# Patient Record
Sex: Female | Born: 1955
Health system: Southern US, Community
[De-identification: ages and names within clinical notes are randomized; demographics above are authoritative.]

## PROBLEM LIST (undated history)

## (undated) DIAGNOSIS — G43909 Migraine, unspecified, not intractable, without status migrainosus: Secondary | ICD-10-CM

## (undated) DIAGNOSIS — E785 Hyperlipidemia, unspecified: Secondary | ICD-10-CM

## (undated) DIAGNOSIS — Z0000 Encounter for general adult medical examination without abnormal findings: Principal | ICD-10-CM

## (undated) DIAGNOSIS — Z860100 Personal history of colon polyps, unspecified: Secondary | ICD-10-CM

## (undated) DIAGNOSIS — N1330 Unspecified hydronephrosis: Secondary | ICD-10-CM

## (undated) DIAGNOSIS — B9689 Other specified bacterial agents as the cause of diseases classified elsewhere: Secondary | ICD-10-CM

## (undated) DIAGNOSIS — K5900 Constipation, unspecified: Secondary | ICD-10-CM

## (undated) DIAGNOSIS — Z973 Presence of spectacles and contact lenses: Secondary | ICD-10-CM

## (undated) DIAGNOSIS — R109 Unspecified abdominal pain: Secondary | ICD-10-CM

## (undated) DIAGNOSIS — J019 Acute sinusitis, unspecified: Secondary | ICD-10-CM

## (undated) DIAGNOSIS — H40009 Preglaucoma, unspecified, unspecified eye: Secondary | ICD-10-CM

## (undated) DIAGNOSIS — M1990 Unspecified osteoarthritis, unspecified site: Secondary | ICD-10-CM

## (undated) HISTORY — DX: Encounter for general adult medical examination without abnormal findings: Z00.00

## (undated) HISTORY — DX: Hyperlipidemia, unspecified: E78.5

## (undated) HISTORY — DX: Constipation, unspecified: K59.00

## (undated) HISTORY — PX: TUBAL LIGATION: SHX77

---

## 1995-07-15 HISTORY — PX: ABDOMINAL HYSTERECTOMY: SHX81

## 2001-07-14 HISTORY — PX: AUGMENTATION MAMMAPLASTY: SUR837

## 2001-07-14 HISTORY — PX: BREAST ENHANCEMENT SURGERY: SHX7

## 2007-03-04 HISTORY — PX: COLONOSCOPY: SHX174

## 2013-09-26 ENCOUNTER — Telehealth: Payer: Self-pay | Admitting: *Deleted

## 2013-10-04 NOTE — Telephone Encounter (Signed)
appt reminder

## 2013-10-17 ENCOUNTER — Encounter: Payer: Self-pay | Admitting: Internal Medicine

## 2013-10-17 ENCOUNTER — Ambulatory Visit (INDEPENDENT_AMBULATORY_CARE_PROVIDER_SITE_OTHER): Payer: 59 | Admitting: Internal Medicine

## 2013-10-17 VITALS — BP 136/88 | HR 72 | Temp 98.5°F | Resp 16 | Wt 146.0 lb

## 2013-10-17 DIAGNOSIS — N951 Menopausal and female climacteric states: Secondary | ICD-10-CM | POA: Insufficient documentation

## 2013-10-17 DIAGNOSIS — E785 Hyperlipidemia, unspecified: Secondary | ICD-10-CM | POA: Insufficient documentation

## 2013-10-17 DIAGNOSIS — B373 Candidiasis of vulva and vagina: Secondary | ICD-10-CM

## 2013-10-17 DIAGNOSIS — Z90710 Acquired absence of both cervix and uterus: Secondary | ICD-10-CM | POA: Insufficient documentation

## 2013-10-17 DIAGNOSIS — N809 Endometriosis, unspecified: Secondary | ICD-10-CM | POA: Insufficient documentation

## 2013-10-17 DIAGNOSIS — K3189 Other diseases of stomach and duodenum: Secondary | ICD-10-CM

## 2013-10-17 DIAGNOSIS — J309 Allergic rhinitis, unspecified: Secondary | ICD-10-CM | POA: Insufficient documentation

## 2013-10-17 DIAGNOSIS — B3731 Acute candidiasis of vulva and vagina: Secondary | ICD-10-CM

## 2013-10-17 DIAGNOSIS — R1013 Epigastric pain: Secondary | ICD-10-CM | POA: Insufficient documentation

## 2013-10-17 NOTE — Progress Notes (Signed)
Subjective:    Patient ID: Alicia Summers, female    DOB: Jan 09, 1956, 58 y.o.   MRN: [id number]  HPI  Alicia Summers is here for first visit to establish primary care.  PMH of hyperlipidemia, symptomatic menopause,   Endometriosis S/P Hysterectomy and allergic rhinitis  She is concerned over her menopausal hot flushes and dyspeptic GI symptoms.   She reports she has been on estradiol (currently  Vivelle Dot .05 mg) prescribed by her GYN for several years duration - she believes around 7 years.  Initially started for symptomatic severe hot flushes.  She also reports going to [place] and having hormone pellets for about 2 years duration.   She became concerned about continuing HT when she found out a friend recently discovered breast cancer .  She stopped her HT abruptly in January of this year.  Hot flushes became so severe , night and day frequently interrupting from sleep that she restarted her Vivelle dot two times a week about 6 weeks ago.  Symptoms better controlled now.    She wishes to discuss long term risks and is wondering about coming off HT.   She is S/P hysterectomY for endometriosis.  Of note she has breast implants since 2003.    She reports recent 2014 mm normal  - I do not have report.  She also has excessive gas and bloating.  She has seen her GI  MD about this and been tested for celiac -reported negative.  She has tried to eliminate gluten from diet.  NO abd pain, no N/VD.  No frank blood or change in color of stools.  Last colonoscopy 8 years ago.    She denies heartburn  Allergies  Allergen Reactions  . Dilaudid [Hydromorphone Hcl] Itching   Past Medical History  Diagnosis Date  . Hyperlipidemia    Past Surgical History  Procedure Laterality Date  . Breast surgery    . Abdominal hysterectomy     History   Social History  . Marital Status: Married    Spouse Name: N/A    Number of Children: N/A  . Years of Education: N/A   Occupational History  . Not on file.    Social History Main Topics  . Smoking status: Never Smoker   . Smokeless tobacco: Never Used  . Alcohol Use: No     Comment: socially   . Drug Use: No  . Sexual Activity: Yes    Birth Control/ Protection: Surgical   Other Topics Concern  . Not on file   Social History Narrative  . No narrative on file   Family History  Problem Relation Age of Onset  . COPD Mother   . Cancer Father 29    prostate  . Cancer Maternal Grandmother 76    colon   Patient Active Problem List   Diagnosis Date Noted  . Hyperlipidemia 10/17/2013  . S/P hysterectomy 10/17/2013  . Allergic rhinitis 10/17/2013  . Endometriosis 10/17/2013  . Yeast vaginitis 10/17/2013  . Menopausal hot flushes 10/17/2013  . Dyspepsia 10/17/2013   No current outpatient prescriptions on file prior to visit.   No current facility-administered medications on file prior to visit.      Review of Systems    see HPI Objective:   Physical Exam Physical Exam  Nursing note and vitals reviewed.  Constitutional: She is oriented to person, place, and time. She appears well-developed and well-nourished.  HENT:  Head: Normocephalic and atraumatic.  Cardiovascular: Normal rate and regular rhythm. Exam reveals  no gallop and no friction rub.  No murmur heard.  Pulmonary/Chest: Breath sounds normal. She has no wheezes. She has no rales.  Abd  BS + no HSM  Soft NT in all quadrant  nondistended Neurological: She is alert and oriented to person, place, and time.  Skin: Skin is warm and dry.  Psychiatric: She has a normal mood and affect. Her behavior is normal.              Assessment & Plan:  Menopausal hot flushes:  I extensively reviewed data from [facility]  Regarding risks of HT - pt given educational handout.   She is very concerned about breast cancer risk and mm interpretation is challenged by breast implants.  It is reasonable to continue HT through the summer and begin slow taper off HT when ambient temps are  cooler.  Pt agreeable to this approach.   Pt to sign for old records and prior mm report  Hyperlipidemia:  On crestor  Will gel recent labs from old records  Allergic rhinitis  Continue Zyrtec  Dyspepsia/bloating    Advised to follow with GI MD for this - OK to try OTC probiotic for now.  She really does not report reflux symptoms  Schedule CPE with me,  Sign old records  45 mins with pt

## 2013-10-17 NOTE — Patient Instructions (Signed)
Mark me as PCP  Schedule CPE

## 2013-12-12 ENCOUNTER — Encounter: Payer: Self-pay | Admitting: Internal Medicine

## 2014-03-13 LAB — HM MAMMOGRAPHY: HM Mammogram: NORMAL

## 2014-03-27 ENCOUNTER — Other Ambulatory Visit: Payer: Self-pay | Admitting: *Deleted

## 2014-03-27 DIAGNOSIS — E785 Hyperlipidemia, unspecified: Secondary | ICD-10-CM

## 2014-03-27 DIAGNOSIS — Z0000 Encounter for general adult medical examination without abnormal findings: Secondary | ICD-10-CM

## 2014-03-28 LAB — LIPID PANEL
Cholesterol: 192 mg/dL (ref 0–200)
HDL: 70 mg/dL (ref >39)
LDL Cholesterol: 97 mg/dL (ref 0–99)
TRIGLYCERIDES: 125 mg/dL (ref <150)
Total CHOL/HDL Ratio: 2.7 Ratio
VLDL: 25 mg/dL (ref 0–40)

## 2014-03-28 LAB — COMPLETE METABOLIC PANEL WITH GFR
ALBUMIN: 4.4 g/dL (ref 3.5–5.2)
ALK PHOS: 52 U/L (ref 39–117)
ALT: 19 U/L (ref 0–35)
AST: 20 U/L (ref 0–37)
BUN: 10 mg/dL (ref 6–23)
CO2: 26 meq/L (ref 19–32)
Calcium: 9.3 mg/dL (ref 8.4–10.5)
Chloride: 104 mEq/L (ref 96–112)
Creat: 0.83 mg/dL (ref 0.50–1.10)
GFR, EST AFRICAN AMERICAN: 89 mL/min
GFR, Est Non African American: 77 mL/min
Glucose, Bld: 90 mg/dL (ref 70–99)
Potassium: 4.1 mEq/L (ref 3.5–5.3)
Sodium: 139 mEq/L (ref 135–145)
Total Bilirubin: 0.6 mg/dL (ref 0.2–1.2)
Total Protein: 6.9 g/dL (ref 6.0–8.3)

## 2014-03-28 LAB — CBC WITH DIFFERENTIAL/PLATELET
BASOS PCT: 1 % (ref 0–1)
Basophils Absolute: 0 10*3/uL (ref 0.0–0.1)
EOS ABS: 0.2 10*3/uL (ref 0.0–0.7)
Eosinophils Relative: 4 % (ref 0–5)
HEMATOCRIT: 40.8 % (ref 36.0–46.0)
HEMOGLOBIN: 13.8 g/dL (ref 12.0–15.0)
LYMPHS ABS: 1.5 10*3/uL (ref 0.7–4.0)
Lymphocytes Relative: 32 % (ref 12–46)
MCH: 30.8 pg (ref 26.0–34.0)
MCHC: 33.8 g/dL (ref 30.0–36.0)
MCV: 91.1 fL (ref 78.0–100.0)
MONO ABS: 0.3 10*3/uL (ref 0.1–1.0)
MONOS PCT: 7 % (ref 3–12)
Neutro Abs: 2.6 10*3/uL (ref 1.7–7.7)
Neutrophils Relative %: 56 % (ref 43–77)
Platelets: 291 10*3/uL (ref 150–400)
RBC: 4.48 MIL/uL (ref 3.87–5.11)
RDW: 13.5 % (ref 11.5–15.5)
WBC: 4.7 10*3/uL (ref 4.0–10.5)

## 2014-03-28 LAB — TSH: TSH: 1.543 u[IU]/mL (ref 0.350–4.500)

## 2014-03-29 LAB — VITAMIN D 25 HYDROXY (VIT D DEFICIENCY, FRACTURES): Vit D, 25-Hydroxy: 40 ng/mL (ref 30–89)

## 2014-04-03 ENCOUNTER — Encounter: Payer: Self-pay | Admitting: Internal Medicine

## 2014-04-03 ENCOUNTER — Ambulatory Visit (INDEPENDENT_AMBULATORY_CARE_PROVIDER_SITE_OTHER): Payer: 59 | Admitting: Internal Medicine

## 2014-04-03 VITALS — BP 134/86 | HR 79 | Temp 97.9°F | Resp 16 | Ht 61.75 in | Wt 145.0 lb

## 2014-04-03 DIAGNOSIS — Z1211 Encounter for screening for malignant neoplasm of colon: Secondary | ICD-10-CM

## 2014-04-03 DIAGNOSIS — E785 Hyperlipidemia, unspecified: Secondary | ICD-10-CM

## 2014-04-03 DIAGNOSIS — L989 Disorder of the skin and subcutaneous tissue, unspecified: Secondary | ICD-10-CM

## 2014-04-03 DIAGNOSIS — Z0000 Encounter for general adult medical examination without abnormal findings: Secondary | ICD-10-CM

## 2014-04-03 DIAGNOSIS — Z23 Encounter for immunization: Secondary | ICD-10-CM

## 2014-04-03 DIAGNOSIS — J3089 Other allergic rhinitis: Secondary | ICD-10-CM

## 2014-04-03 DIAGNOSIS — N951 Menopausal and female climacteric states: Secondary | ICD-10-CM

## 2014-04-03 LAB — HEMOCCULT GUIAC POC 1CARD (OFFICE): FECAL OCCULT BLD: NEGATIVE

## 2014-04-03 LAB — POCT URINALYSIS DIPSTICK
BILIRUBIN UA: NEGATIVE
Blood, UA: NEGATIVE
GLUCOSE UA: NEGATIVE
KETONES UA: NEGATIVE
LEUKOCYTES UA: NEGATIVE
Nitrite, UA: NEGATIVE
Protein, UA: NEGATIVE
Spec Grav, UA: 1.01
Urobilinogen, UA: NEGATIVE
pH, UA: 6.5

## 2014-04-03 NOTE — Progress Notes (Signed)
Subjective:    Patient ID: Alicia Summers, female    DOB: [birth year], 58 y.o.   MRN: [id number]  HPI Last OV: Menopausal hot flushes: I extensively reviewed data from [facility] Regarding risks of HT - pt given educational handout. She is very concerned about breast cancer risk and mm interpretation is challenged by breast implants. It is reasonable to continue HT through the summer and begin slow taper off HT when ambient temps are cooler. Pt agreeable to this approach. Pt to sign for old records and prior mm report  Hyperlipidemia: On crestor Will gel recent labs from old records  Allergic rhinitis Continue Zyrtec  Dyspepsia/bloating Advised to follow with GI MD for this - OK to try OTC probiotic for now. She really does not report reflux symptoms  Schedule CPE with    Todays' visit   [name] is here for CPE  HM:  UTD with mm 02/2014  Gering,  colonsocopy due 2016 pt is a non-smoker  .  S/P hysterectomy for benign reasons  Hyerplipidemia on Crestor  No myalgias   Allergic rhinitis taking flonase  Skin lesion Left shoulder  FH of skin ca  Menopausal hot flushes  Kim would like to taper off her HT   She does not RUQ discomfort for at least 6 months she tells me she has seen her GI MD and has had Ct scan,  Gallbladder scan, and endoscopy with Dr. Alice Reichert  and mulltiple work up with prior Md  I do not have records.  She is on a probiotic  She has not had pain in several weeks now.      Allergies  Allergen Reactions  . Dilaudid [Hydromorphone Hcl] Itching   Past Medical History  Diagnosis Date  . Hyperlipidemia    Past Surgical History  Procedure Laterality Date  . Breast surgery    . Abdominal hysterectomy     History   Social History  . Marital Status: Married    Spouse Name: N/A    Number of Children: N/A  . Years of Education: N/A   Occupational History  . Not on file.   Social History Main Topics  . Smoking status: Never Smoker   . Smokeless tobacco: Never Used  .  Alcohol Use: No     Comment: socially   . Drug Use: No  . Sexual Activity: Yes    Birth Control/ Protection: Surgical   Other Topics Concern  . Not on file   Social History Narrative  . No narrative on file   Family History  Problem Relation Age of Onset  . COPD Mother   . Cancer Father 48    prostate  . Cancer Maternal Grandmother 88    colon   Patient Active Problem List   Diagnosis Date Noted  . Hyperlipidemia 10/17/2013  . S/P hysterectomy 10/17/2013  . Allergic rhinitis 10/17/2013  . Endometriosis 10/17/2013  . Yeast vaginitis 10/17/2013  . Menopausal hot flushes 10/17/2013  . Dyspepsia 10/17/2013   Current Outpatient Prescriptions on File Prior to Visit  Medication Sig Dispense Refill  . Cetirizine HCl (ZYRTEC ALLERGY) 10 MG CAPS Take by mouth.      . estradiol (VIVELLE-DOT) 0.05 MG/24HR patch Place 1 patch onto the skin 2 (two) times a week.      . rosuvastatin (CRESTOR) 10 MG tablet Take 10 mg by mouth daily.       No current facility-administered medications on file prior to visit.       Review  of Systems     Objective:   Physical Exam Physical Exam  Nursing note and vitals reviewed.  Constitutional: She is oriented to person, place, and time. She appears well-developed and well-nourished.  HENT:  Head: Normocephalic and atraumatic.  Right Ear: Tympanic membrane and ear canal normal. No drainage. Tympanic membrane is not injected and not erythematous.  Left Ear: Tympanic membrane and ear canal normal. No drainage. Tympanic membrane is not injected and not erythematous.  Nose: Nose normal. Right sinus exhibits no maxillary sinus tenderness and no frontal sinus tenderness. Left sinus exhibits no maxillary sinus tenderness and no frontal sinus tenderness.  Mouth/Throat: Oropharynx is clear and moist. No oral lesions. No oropharyngeal exudate.  Eyes: Conjunctivae and EOM are normal. Pupils are equal, round, and reactive to light.  Neck: Normal range of  motion. Neck supple. No JVD present. Carotid bruit is not present. No mass and no thyromegaly present.  Cardiovascular: Normal rate, regular rhythm, S1 normal, S2 normal and intact distal pulses. Exam reveals no gallop and no friction rub.  No murmur heard.  Pulses:  Carotid pulses are 2+ on the right side, and 2+ on the left side.  Dorsalis pedis pulses are 2+ on the right side, and 2+ on the left side.  No carotid bruit. No LE edema  Pulmonary/Chest: Breath sounds normal. She has no wheezes. She has no rales. She exhibits no tenderness.  Breast bilateral implants  No discrete masses no nipple discharge no axillary adenopathy bilaterally  Abdominal: Soft. Bowel sounds are normal. She exhibits no distension and no mass. There is no hepatosplenomegaly. There is no tenderness. There is no CVA tenderness.  Musculoskeletal: Normal range of motion.  No active synovitis to joints.  Lymphadenopathy:  She has no cervical adenopathy.  She has no axillary adenopathy.  Right: No inguinal and no supraclavicular adenopathy present.  Left: No inguinal and no supraclavicular adenopathy present.  Neurological: She is alert and oriented to person, place, and time. She has normal strength and normal reflexes. She displays no tremor. No cranial nerve deficit or sensory deficit. Coordination and gait normal.  Skin: Skin is warm and dry. No rash noted. No cyanosis. Nails show no clubbing.   Lesion left shoulder Psychiatric: She has a normal mood and affect. Her speech is normal and behavior is normal. Cognition and memory are normal.           Assessment & Plan:  HM:  Pt advised lung cancer screening guideline  She is a non smoker.  MM negative    Skin lesion  Strong FH of skin ca  Will set up appt with Derm office   Hyperlipidemia  Continue crestor  RUQ abd pain  Will need  old records  Menopausal hot flushes  Advised how to taper vivielle dot over next 3 months    GERD

## 2014-04-03 NOTE — Patient Instructions (Addendum)
Will refer to Dermatology Dr. Delman Cheadle      Will set up appt for you   See me as needed     Your were given a flu vaccine today   For vivelle patch  Use 1/2 patch on second day of week for 30 days then cut patch in 1/2 and use 1/2 patch  twice a week for 30 days in November, then 1/4 patch twice a week in December  And can stop patches mid January    Get cooling pillow

## 2014-04-05 ENCOUNTER — Encounter: Payer: Self-pay | Admitting: *Deleted

## 2014-04-14 ENCOUNTER — Encounter: Payer: Self-pay | Admitting: *Deleted

## 2014-05-02 ENCOUNTER — Other Ambulatory Visit: Payer: Self-pay | Admitting: *Deleted

## 2014-05-02 ENCOUNTER — Telehealth: Payer: Self-pay

## 2014-05-02 MED ORDER — FLUCONAZOLE 150 MG PO TABS: 150.0000 mg | ORAL_TABLET | Freq: Once | ORAL | Status: DC

## 2014-05-02 NOTE — Telephone Encounter (Signed)
Alicia Summers [phone] [id number], [place] called to see if Dr Coralyn Mark could call her in something for a recurrent Yeast infection. Dr Coralyn Mark said she would, so I told [name] we would call something in, but if this did not help to call back and make an appointment to be seen.

## 2014-05-15 ENCOUNTER — Encounter: Payer: Self-pay | Admitting: Internal Medicine

## 2014-06-27 ENCOUNTER — Other Ambulatory Visit: Payer: Self-pay | Admitting: Internal Medicine

## 2014-06-27 ENCOUNTER — Encounter: Payer: Self-pay | Admitting: Internal Medicine

## 2014-06-27 ENCOUNTER — Ambulatory Visit (INDEPENDENT_AMBULATORY_CARE_PROVIDER_SITE_OTHER): Payer: 59 | Admitting: Internal Medicine

## 2014-06-27 VITALS — BP 137/80 | HR 63 | Resp 16 | Ht 62.5 in | Wt 142.0 lb

## 2014-06-27 DIAGNOSIS — G441 Vascular headache, not elsewhere classified: Secondary | ICD-10-CM

## 2014-06-27 DIAGNOSIS — N898 Other specified noninflammatory disorders of vagina: Secondary | ICD-10-CM

## 2014-06-27 DIAGNOSIS — N951 Menopausal and female climacteric states: Secondary | ICD-10-CM

## 2014-06-27 MED ORDER — NABUMETONE 500 MG PO TABS: ORAL_TABLET | ORAL | Status: DC

## 2014-06-27 MED ORDER — CLONIDINE HCL 0.1 MG PO TABS: ORAL_TABLET | ORAL | Status: DC

## 2014-06-27 NOTE — Progress Notes (Signed)
Subjective:    Patient ID: Alicia Summers, female    DOB: 1955-12-03, 58 y.o.   MRN: [id number]  HPI 9/21 HM: Pt advised lung cancer screening guideline She is a non smoker. MM negative   Skin lesion Strong FH of skin ca Will set up appt with Derm office   Hyperlipidemia Continue crestor  RUQ abd pain Will need old records  Menopausal hot flushes Advised how to taper vivielle dot over next 3 months   GERD   TODAY :  Alicia Summers is here for several acute issues  Tension headaches have increased as she is helping to be primary caretaker for her 3 grandchildren as daughter has post-partum depression   NO visual or speech changes no numbness or muscle weakness  She has white vaginal discharge  No odor no itching  Menopausal flushes.  She is tapering off her vivelle. Waking up at night sweating   NO flushes during the day     Allergies  Allergen Reactions  . Dilaudid [Hydromorphone Hcl] Itching   Past Medical History  Diagnosis Date  . Hyperlipidemia    Past Surgical History  Procedure Laterality Date  . Breast surgery    . Abdominal hysterectomy     History   Social History  . Marital Status: Married    Spouse Name: N/A    Number of Children: N/A  . Years of Education: N/A   Occupational History  . Not on file.   Social History Main Topics  . Smoking status: Never Smoker   . Smokeless tobacco: Never Used  . Alcohol Use: No     Comment: socially   . Drug Use: No  . Sexual Activity: Yes    Birth Control/ Protection: Surgical   Other Topics Concern  . Not on file   Social History Narrative   Family History  Problem Relation Age of Onset  . COPD Mother   . Cancer Father 59    prostate  . Cancer Maternal Grandmother 32    colon   Patient Active Problem List   Diagnosis Date Noted  . Hyperlipidemia 10/17/2013  . S/P hysterectomy 10/17/2013  . Allergic rhinitis 10/17/2013  . Endometriosis 10/17/2013  . Yeast vaginitis 10/17/2013  .  Menopausal hot flushes 10/17/2013  . Dyspepsia 10/17/2013   Current Outpatient Prescriptions on File Prior to Visit  Medication Sig Dispense Refill  . Cetirizine HCl (ZYRTEC ALLERGY) 10 MG CAPS Take by mouth.    . estradiol (VIVELLE-DOT) 0.05 MG/24HR patch Place 1 patch onto the skin 2 (two) times a week.    . fluconazole (DIFLUCAN) 150 MG tablet Take 1 tablet (150 mg total) by mouth once. 1 tablet 0  . pantoprazole (PROTONIX) 40 MG tablet Take 40 mg by mouth daily.    . rosuvastatin (CRESTOR) 10 MG tablet Take 10 mg by mouth daily.     No current facility-administered medications on file prior to visit.       Review of Systems See HPI    Objective:   Physical Exam  Physical Exam  Nursing note and vitals reviewed.  Constitutional: She is oriented to person, place, and time. She appears well-developed and well-nourished.  HENT:  Head: Normocephalic and atraumatic.  Cardiovascular: Normal rate and regular rhythm. Exam reveals no gallop and no friction rub.  No murmur heard.  Pulmonary/Chest: Breath sounds normal. She has no wheezes. She has no rales.  Pelvic   Nl external genitalia Vagina no lesions  Discharge clear and no odor  Neurological: She is alert and oriented to person, place, and time.  Skin: Skin is warm and dry.  Psychiatric: She has a normal mood and affect. Her behavior is normal.             Assessment & Plan:  HOt flushes  Will start clonidine 1/2 tab every other night for one week then 1/2 tab nightly   See me in 8 weeks  Vaginal  Discharge   She had finished diflucan   Will send Koh/wet preg GC and chlamydia   Headache likely stress related  Ok for relafen

## 2014-06-27 NOTE — Patient Instructions (Addendum)
See me in February    Call office Thursday for results    Take clonidine  1/2 tablet every other night for one week then 1/2 tablet nightly   Use relafen for headache

## 2014-06-28 LAB — GC/CHLAMYDIA PROBE AMP
CT PROBE, AMP APTIMA: NEGATIVE
GC Probe RNA: NEGATIVE

## 2014-06-28 LAB — WET PREP BY MOLECULAR PROBE
CANDIDA SPECIES: NEGATIVE
Gardnerella vaginalis: POSITIVE — AB
Trichomonas vaginosis: NEGATIVE

## 2014-06-29 ENCOUNTER — Telehealth: Payer: Self-pay | Admitting: Internal Medicine

## 2014-06-29 MED ORDER — METRONIDAZOLE 0.75 % VA GEL: VAGINAL | Status: DC

## 2014-06-29 NOTE — Telephone Encounter (Signed)
Alicia Summers  Call pt and tell her that she has a vaginal infection called bacterial vaginosis  Very common.  I e-scribed Metrogel vaginal gel nightly for one week  Ok to mail labs to her if she wishes.

## 2014-06-29 NOTE — Telephone Encounter (Signed)
I spoke with Alicia Summers and let her know about her lab results. She is going to pick up her RX at [facility]

## 2014-07-26 ENCOUNTER — Encounter: Payer: Self-pay | Admitting: Internal Medicine

## 2014-07-26 NOTE — Progress Notes (Signed)
Subjective:    Patient ID: Alicia Summers, female    DOB: 21-Jan-1956, 59 y.o.   MRN: [id number]  HPI  06/2014 visit HOt flushes Will start clonidine 1/2 tab every other night for one week then 1/2 tab nightly See me in 8 weeks  Vaginal Discharge She had finished diflucan Will send Koh/wet preg GC and chlamydia   Headache likely stress related Ok for relafen  TODAY:  Alicia Summers was seen last month for vaginal discharge.  She was positive for Gardnerella and GC/chlamydia, KOH wet pret all neg     Allergies  Allergen Reactions  . Dilaudid [Hydromorphone Hcl] Itching   Past Medical History  Diagnosis Date  . Hyperlipidemia    Past Surgical History  Procedure Laterality Date  . Breast surgery    . Abdominal hysterectomy     History   Social History  . Marital Status: Married    Spouse Name: N/A    Number of Children: N/A  . Years of Education: N/A   Occupational History  . Not on file.   Social History Main Topics  . Smoking status: Never Smoker   . Smokeless tobacco: Never Used  . Alcohol Use: No     Comment: socially   . Drug Use: No  . Sexual Activity: Yes    Birth Control/ Protection: Surgical   Other Topics Concern  . Not on file   Social History Narrative   Family History  Problem Relation Age of Onset  . COPD Mother   . Cancer Father 61    prostate  . Cancer Maternal Grandmother 77    colon   Patient Active Problem List   Diagnosis Date Noted  . Hyperlipidemia 10/17/2013  . S/P hysterectomy 10/17/2013  . Allergic rhinitis 10/17/2013  . Endometriosis 10/17/2013  . Yeast vaginitis 10/17/2013  . Menopausal hot flushes 10/17/2013  . Dyspepsia 10/17/2013   Current Outpatient Prescriptions on File Prior to Visit  Medication Sig Dispense Refill  . Cetirizine HCl (ZYRTEC ALLERGY) 10 MG CAPS Take by mouth.    . cloNIDine (CATAPRES) 0.1 MG tablet Take 1/2 tablet every other night for 1 week then 1/2 tablet nightly 30 tablet 0  . estradiol  (VIVELLE-DOT) 0.05 MG/24HR patch Place 1 patch onto the skin 2 (two) times a week.    . isometheptene-acetaminophen-dichloralphenazone (MIDRIN) 65-325-100 MG capsule Take 1 capsule by mouth 4 (four) times daily as needed for migraine. Maximum 5 capsules in 12 hours for migraine headaches, 8 capsules in 24 hours for tension headaches.    . metroNIDAZOLE (METROGEL VAGINAL) 0.75 % vaginal gel One vaginal applicator full nightly for 7 nights 70 g 0  . nabumetone (RELAFEN) 500 MG tablet One orally bid for headache 20 tablet 0  . rosuvastatin (CRESTOR) 10 MG tablet Take 10 mg by mouth daily.    . timolol (BETIMOL) 0.25 % ophthalmic solution 1-2 drops 2 (two) times daily.     No current facility-administered medications on file prior to visit.        Review of Systems See HPI    Objective:   Physical Exam  Physical Exam  Nursing note and vitals reviewed.  Constitutional: She is oriented to person, place, and time. She appears well-developed and well-nourished.  HENT:  Head: Normocephalic and atraumatic.  Cardiovascular: Normal rate and regular rhythm. Exam reveals no gallop and no friction rub.  No murmur heard.  Pulmonary/Chest: Breath sounds normal. She has no wheezes. She has no rales.  Neurological: She is alert  and oriented to person, place, and time.  Skin: Skin is warm and dry.  Psychiatric: She has a normal mood and affect. Her behavior is normal.             Assessment & Plan:

## 2014-07-27 ENCOUNTER — Other Ambulatory Visit: Payer: Self-pay | Admitting: Internal Medicine

## 2014-07-27 ENCOUNTER — Ambulatory Visit (INDEPENDENT_AMBULATORY_CARE_PROVIDER_SITE_OTHER): Payer: 59 | Admitting: Internal Medicine

## 2014-07-27 ENCOUNTER — Encounter: Payer: Self-pay | Admitting: Internal Medicine

## 2014-07-27 ENCOUNTER — Ambulatory Visit: Payer: Self-pay | Admitting: Internal Medicine

## 2014-07-27 ENCOUNTER — Telehealth: Payer: Self-pay | Admitting: *Deleted

## 2014-07-27 VITALS — BP 155/90 | HR 74 | Resp 16 | Ht 62.0 in | Wt 143.0 lb

## 2014-07-27 DIAGNOSIS — F439 Reaction to severe stress, unspecified: Secondary | ICD-10-CM

## 2014-07-27 DIAGNOSIS — R232 Flushing: Secondary | ICD-10-CM

## 2014-07-27 DIAGNOSIS — Z658 Other specified problems related to psychosocial circumstances: Secondary | ICD-10-CM

## 2014-07-27 DIAGNOSIS — Z1211 Encounter for screening for malignant neoplasm of colon: Secondary | ICD-10-CM

## 2014-07-27 DIAGNOSIS — N898 Other specified noninflammatory disorders of vagina: Secondary | ICD-10-CM

## 2014-07-27 LAB — POCT URINALYSIS DIPSTICK
Bilirubin, UA: NEGATIVE
Glucose, UA: NEGATIVE
Ketones, UA: NEGATIVE
Leukocytes, UA: NEGATIVE
NITRITE UA: NEGATIVE
Protein, UA: NEGATIVE
RBC UA: NEGATIVE
Spec Grav, UA: 1.015
UROBILINOGEN UA: NEGATIVE
pH, UA: 6.5

## 2014-07-27 LAB — HEMOCCULT GUIAC POC 1CARD (OFFICE)
FECAL OCCULT BLD: NEGATIVE
OCCULT BLOOD DATE: 1142016

## 2014-07-27 MED ORDER — ROSUVASTATIN CALCIUM 10 MG PO TABS: 10.0000 mg | ORAL_TABLET | Freq: Every day | ORAL | Status: DC

## 2014-07-27 NOTE — Telephone Encounter (Signed)
I left Alicia Summers a message in regards to her appointment with Dr. Lynnette Caffey on 08/15/14@ 11:40am -eh

## 2014-07-27 NOTE — Patient Instructions (Signed)
Will set up referral to GYN

## 2014-07-27 NOTE — Progress Notes (Signed)
Subjective:    Patient ID: Alicia Summers, female    DOB: 1956/06/07, 59 y.o.   MRN: [id number]  HPI 12/15 note HOt flushes Will start clonidine 1/2 tab every other night for one week then 1/2 tab nightly See me in 8 weeks  Vaginal Discharge She had finished diflucan Will send Koh/wet preg GC and chlamydia   Headache likely stress related Ok for relafen    Today:  Alicia Summers returns for acute visit.    She was pos for Gardnerella on 12/15.  All other tests negative.  She has finished metronidazole vaginal  gel with reported improvement in vaginal discharge.     5 days ago had bright red blood on underwear.  No dysuria or urgency no diarrhea.  Alicia Summers tells me blood was coming from vaginal area because   "I checked with my finger"    She had a tube of Estrace creme from her prior GYn which she used one time and has not seen any blood since.   CT abd with contrast done 5/15 suggests R liver cyst and sub cm hypodensities of kidneys felt to represent cysts but to small to characterize.  Otherwise unremarkable   Alicia Summers does describe lots of family stress with younger sister dying of cirrhosis in [place].    Allergies  Allergen Reactions  . Dilaudid [Hydromorphone Hcl] Itching   Past Medical History  Diagnosis Date  . Hyperlipidemia    Past Surgical History  Procedure Laterality Date  . Breast surgery    . Abdominal hysterectomy     History   Social History  . Marital Status: Married    Spouse Name: N/A    Number of Children: N/A  . Years of Education: N/A   Occupational History  . Not on file.   Social History Main Topics  . Smoking status: Never Smoker   . Smokeless tobacco: Never Used  . Alcohol Use: No     Comment: socially   . Drug Use: No  . Sexual Activity: Yes    Birth Control/ Protection: Surgical   Other Topics Concern  . Not on file   Social History Narrative   Family History  Problem Relation Age of Onset  . COPD Mother   . Cancer Father 49   prostate  . Cancer Maternal Grandmother 35    colon   Patient Active Problem List   Diagnosis Date Noted  . Hyperlipidemia 10/17/2013  . S/P hysterectomy 10/17/2013  . Allergic rhinitis 10/17/2013  . Endometriosis 10/17/2013  . Yeast vaginitis 10/17/2013  . Menopausal hot flushes 10/17/2013  . Dyspepsia 10/17/2013   Current Outpatient Prescriptions on File Prior to Visit  Medication Sig Dispense Refill  . Cetirizine HCl (ZYRTEC ALLERGY) 10 MG CAPS Take by mouth.    . metroNIDAZOLE (METROGEL VAGINAL) 0.75 % vaginal gel One vaginal applicator full nightly for 7 nights 70 g 0  . rosuvastatin (CRESTOR) 10 MG tablet Take 10 mg by mouth daily.    . timolol (BETIMOL) 0.25 % ophthalmic solution 1-2 drops 2 (two) times daily.    Marland Kitchen isometheptene-acetaminophen-dichloralphenazone (MIDRIN) 65-325-100 MG capsule Take 1 capsule by mouth 4 (four) times daily as needed for migraine. Maximum 5 capsules in 12 hours for migraine headaches, 8 capsules in 24 hours for tension headaches.    . nabumetone (RELAFEN) 500 MG tablet One orally bid for headache (Patient not taking: Reported on 07/27/2014) 20 tablet 0   No current facility-administered medications on file prior to visit.  Review of Systems    see HPI Objective:   Physical Exam Physical Exam  Nursing note and vitals reviewed.   Tearful in office  Constitutional: She is oriented to person, place, and time. She appears well-developed and well-nourished.  HENT:  Head: Normocephalic and atraumatic.  Cardiovascular: Normal rate and regular rhythm. Exam reveals no gallop and no friction rub.  No murmur heard.  Pulmonary/Chest: Breath sounds normal. She has no wheezes. She has no rales.  ABD  BS pos soft non-tender no distended  No HSM  Rectal no mass guaiac neg. Pelvic nl external genitalia  She is S/P hysterectomy for pelvic prolapse Vagina  I see no lesions that can explain bleeding Neurological: She is alert and oriented to person,  place, and time.  Skin: Skin is warm and dry.  Psychiatric: She has a normal mood and affect. Her behavior is normal.        Assessment & Plan:  Blood noted on clothing. :  Vaginal exam unrevealing for cause but pt wishes to make appt with a new GYN   Will refer. U/a CHEmstrip and hemocult both neg in office.    She is S/P hysterectomy for prolapse and endometriosis  Vasomotor flushes:  Alicia Summers is off HT now .  Could not tolerate clonidine  States vasomotor " flushes not so bad now"  Situational stress.  Sister has advanced cirrhosis apparently on life support in [place].

## 2014-08-23 ENCOUNTER — Encounter: Payer: Self-pay | Admitting: *Deleted

## 2014-08-30 ENCOUNTER — Ambulatory Visit: Payer: 59 | Admitting: Internal Medicine

## 2014-12-08 ENCOUNTER — Telehealth: Payer: Self-pay | Admitting: *Deleted

## 2014-12-08 ENCOUNTER — Encounter: Payer: Self-pay | Admitting: *Deleted

## 2014-12-08 NOTE — Telephone Encounter (Signed)
Pre-Visit Call completed with patient and chart updated.   Pre-Visit Info documented in Specialty Comments under [employer].    

## 2014-12-12 ENCOUNTER — Ambulatory Visit (INDEPENDENT_AMBULATORY_CARE_PROVIDER_SITE_OTHER): Payer: 59 | Admitting: Family Medicine

## 2014-12-12 ENCOUNTER — Encounter: Payer: Self-pay | Admitting: Family Medicine

## 2014-12-12 VITALS — BP 122/82 | HR 74 | Temp 98.5°F | Ht 62.0 in | Wt 142.5 lb

## 2014-12-12 DIAGNOSIS — R1011 Right upper quadrant pain: Secondary | ICD-10-CM | POA: Diagnosis not present

## 2014-12-12 DIAGNOSIS — R6889 Other general symptoms and signs: Secondary | ICD-10-CM

## 2014-12-12 DIAGNOSIS — E785 Hyperlipidemia, unspecified: Secondary | ICD-10-CM

## 2014-12-12 DIAGNOSIS — E782 Mixed hyperlipidemia: Secondary | ICD-10-CM

## 2014-12-12 DIAGNOSIS — N809 Endometriosis, unspecified: Secondary | ICD-10-CM

## 2014-12-12 DIAGNOSIS — R1013 Epigastric pain: Secondary | ICD-10-CM | POA: Diagnosis not present

## 2014-12-12 DIAGNOSIS — Z8619 Personal history of other infectious and parasitic diseases: Secondary | ICD-10-CM | POA: Insufficient documentation

## 2014-12-12 DIAGNOSIS — N951 Menopausal and female climacteric states: Secondary | ICD-10-CM

## 2014-12-12 DIAGNOSIS — Z1211 Encounter for screening for malignant neoplasm of colon: Secondary | ICD-10-CM | POA: Diagnosis not present

## 2014-12-12 DIAGNOSIS — J3089 Other allergic rhinitis: Secondary | ICD-10-CM

## 2014-12-12 MED ORDER — ROSUVASTATIN CALCIUM 10 MG PO TABS: 10.0000 mg | ORAL_TABLET | Freq: Every day | ORAL | Status: DC

## 2014-12-12 NOTE — Assessment & Plan Note (Signed)
Small, frequent meals with lean proteins and complex carbs (use the size of fist) 6-8 hours sleep MegaRed krill oil cap daily abySchiff Exercise daily but not before bed No blue-green glowing lights 1 hour before bedtime Icool daily can get at [employer]

## 2014-12-12 NOTE — Progress Notes (Signed)
Pre visit review using our clinic review tool, if applicable. No additional management support is needed unless otherwise documented below in the visit note. 

## 2014-12-12 NOTE — Patient Instructions (Addendum)
Small, frequent meals with lean proteins and complex carbs (use the size of fist) 6-8 hours sleep MegaRed krill oil cap daily by Schiff Exercise daily but not before bed No blue-green glowing lights 1 hour before bedtime Icool daily can get at [employer]  Probiotics, Digestive Advantage or [place]in Negus colon health or online [place] 10 strain probiotic Perimenopause Perimenopause is the time when your body begins to move into the menopause (no menstrual period for 12 straight months). It is a natural process. Perimenopause can begin 2-8 years before the menopause and usually lasts for 1 year after the menopause. During this time, your ovaries may or may not produce an egg. The ovaries vary in their production of estrogen and progesterone hormones each month. This can cause irregular menstrual periods, difficulty getting pregnant, vaginal bleeding between periods, and uncomfortable symptoms. CAUSES  Irregular production of the ovarian hormones, estrogen and progesterone, and not ovulating every month.  Other causes include:  Tumor of the pituitary gland in the brain.  Medical disease that affects the ovaries.  Radiation treatment.  Chemotherapy.  Unknown causes.  Heavy smoking and excessive alcohol intake can bring on perimenopause sooner. SIGNS AND SYMPTOMS   Hot flashes.  Night sweats.  Irregular menstrual periods.  Decreased sex drive.  Vaginal dryness.  Headaches.  Mood swings.  Depression.  Memory problems.  Irritability.  Tiredness.  Weight gain.  Trouble getting pregnant.  The beginning of losing bone cells (osteoporosis).  The beginning of hardening of the arteries (atherosclerosis). DIAGNOSIS  Your health care provider will make a diagnosis by analyzing your age, menstrual history, and symptoms. He or she will do a physical exam and note any changes in your body, especially your female organs. Female hormone tests may or may not be helpful depending on  the amount of female hormones you produce and when you produce them. However, other hormone tests may be helpful to rule out other problems. TREATMENT  In some cases, no treatment is needed. The decision on whether treatment is necessary during the perimenopause should be made by you and your health care provider based on how the symptoms are affecting you and your lifestyle. Various treatments are available, such as:  Treating individual symptoms with a specific medicine for that symptom.  Herbal medicines that can help specific symptoms.  Counseling.  Group therapy. HOME CARE INSTRUCTIONS   Keep track of your menstrual periods (when they occur, how heavy they are, how long between periods, and how long they last) as well as your symptoms and when they started.  Only take over-the-counter or prescription medicines as directed by your health care provider.  Sleep and rest.  Exercise.  Eat a diet that contains calcium (good for your bones) and soy (acts like the estrogen hormone).  Do not smoke.  Avoid alcoholic beverages.  Take vitamin supplements as recommended by your health care provider. Taking vitamin E may help in certain cases.  Take calcium and vitamin D supplements to help prevent bone loss.  Group therapy is sometimes helpful.  Acupuncture may help in some cases. SEEK MEDICAL CARE IF:   You have questions about any symptoms you are having.  You need a referral to a specialist (gynecologist, psychiatrist, or psychologist). SEEK IMMEDIATE MEDICAL CARE IF:   You have vaginal bleeding.  Your period lasts longer than 8 days.  Your periods are recurring sooner than 21 days.  You have bleeding after intercourse.  You have severe depression.  You have pain when you urinate.  You have severe headaches.  You have vision problems. Document Released: 08/07/2004 Document Revised: 04/20/2013 Document Reviewed: 01/27/2013 [facility] Patient Information 2015  [place], [place]. This information is not intended to replace advice given to you by your health care provider. Make sure you discuss any questions you have with your health care provider.

## 2014-12-18 ENCOUNTER — Other Ambulatory Visit: Payer: 59

## 2014-12-18 LAB — LIPID PANEL
Cholesterol: 154 mg/dL (ref 0–200)
HDL: 64.9 mg/dL (ref >39.00)
LDL Cholesterol: 78 mg/dL (ref 0–99)
NONHDL: 89.1
Total CHOL/HDL Ratio: 2
Triglycerides: 58 mg/dL (ref 0.0–149.0)
VLDL: 11.6 mg/dL (ref 0.0–40.0)

## 2014-12-18 LAB — CBC
HCT: 38.8 % (ref 36.0–46.0)
Hemoglobin: 13.2 g/dL (ref 12.0–15.0)
MCHC: 34.1 g/dL (ref 30.0–36.0)
MCV: 90.6 fl (ref 78.0–100.0)
Platelets: 228 10*3/uL (ref 150.0–400.0)
RBC: 4.28 Mil/uL (ref 3.87–5.11)
RDW: 12.7 % (ref 11.5–15.5)
WBC: 4.2 10*3/uL (ref 4.0–10.5)

## 2014-12-18 LAB — COMPREHENSIVE METABOLIC PANEL
ALT: 18 U/L (ref 0–35)
AST: 22 U/L (ref 0–37)
Albumin: 4.4 g/dL (ref 3.5–5.2)
Alkaline Phosphatase: 49 U/L (ref 39–117)
BILIRUBIN TOTAL: 0.5 mg/dL (ref 0.2–1.2)
BUN: 17 mg/dL (ref 6–23)
CO2: 29 mEq/L (ref 19–32)
Calcium: 9.3 mg/dL (ref 8.4–10.5)
Chloride: 106 mEq/L (ref 96–112)
Creatinine, Ser: 0.73 mg/dL (ref 0.40–1.20)
GFR: 86.43 mL/min (ref >60.00)
Glucose, Bld: 78 mg/dL (ref 70–99)
Potassium: 3.9 mEq/L (ref 3.5–5.1)
Sodium: 139 mEq/L (ref 135–145)
Total Protein: 6.9 g/dL (ref 6.0–8.3)

## 2014-12-18 LAB — TSH: TSH: 1.82 u[IU]/mL (ref 0.35–4.50)

## 2014-12-21 ENCOUNTER — Encounter (HOSPITAL_BASED_OUTPATIENT_CLINIC_OR_DEPARTMENT_OTHER): Payer: Self-pay

## 2014-12-21 ENCOUNTER — Other Ambulatory Visit: Payer: Self-pay | Admitting: Family Medicine

## 2014-12-21 ENCOUNTER — Ambulatory Visit (HOSPITAL_BASED_OUTPATIENT_CLINIC_OR_DEPARTMENT_OTHER)
Admission: RE | Admit: 2014-12-21 | Discharge: 2014-12-21 | Disposition: A | Discharge status: Home / Self Care | Payer: 59 | Source: Ambulatory Visit | Attending: Family Medicine | Admitting: Family Medicine

## 2014-12-21 DIAGNOSIS — K7689 Other specified diseases of liver: Secondary | ICD-10-CM | POA: Insufficient documentation

## 2014-12-21 DIAGNOSIS — K5730 Diverticulosis of large intestine without perforation or abscess without bleeding: Secondary | ICD-10-CM | POA: Insufficient documentation

## 2014-12-21 DIAGNOSIS — N134 Hydroureter: Secondary | ICD-10-CM | POA: Diagnosis not present

## 2014-12-21 DIAGNOSIS — R1013 Epigastric pain: Secondary | ICD-10-CM

## 2014-12-21 DIAGNOSIS — R1011 Right upper quadrant pain: Secondary | ICD-10-CM

## 2014-12-21 DIAGNOSIS — R109 Unspecified abdominal pain: Secondary | ICD-10-CM | POA: Diagnosis present

## 2014-12-21 DIAGNOSIS — Z1211 Encounter for screening for malignant neoplasm of colon: Secondary | ICD-10-CM

## 2014-12-21 LAB — IGG FOOD PANEL
ALLERGEN BEEF IGG: 6.6 ug/mL — AB (ref <2.0)
Allergen, Milk, IgG: 8.84 ug/mL — ABNORMAL HIGH (ref <0.15)
Chicken, IgG: <0.15 ug/mL (ref <0.15)
Egg white, IgG: 7.7 ug/mL — ABNORMAL HIGH (ref <2.0)
Egg yolk, IgG: 5.9 ug/mL — ABNORMAL HIGH (ref <2.0)
Peanut, IgG: <0.15 ug/mL (ref <0.15)
Wheat, IgG: <0.15 ug/mL (ref <0.15)

## 2014-12-21 MED ORDER — IOHEXOL 300 MG/ML  SOLN
100.0000 mL | Freq: Once | INTRAMUSCULAR | Status: AC | PRN
  Administered 2014-12-21: 100 mL via INTRAVENOUS

## 2014-12-24 DIAGNOSIS — Z1211 Encounter for screening for malignant neoplasm of colon: Secondary | ICD-10-CM | POA: Insufficient documentation

## 2014-12-24 NOTE — Assessment & Plan Note (Signed)
Uses Cetirizine with some results. May add Flonase as needed

## 2014-12-24 NOTE — Assessment & Plan Note (Signed)
Tolerating statin, encouraged heart healthy diet, avoid trans fats, minimize simple carbs and saturated fats. Increase exercise as tolerated 

## 2014-12-24 NOTE — Assessment & Plan Note (Signed)
Has not had colonoscopy to date, agrees to referral after long discussion

## 2014-12-24 NOTE — Assessment & Plan Note (Signed)
Previous imaging and work up unremarkable, limited sensitivity testing today reveals reactivity to numerous foods, including wheat, beef and eggs encouraged to avoid these foods and started on a probiotic, referred to GI for further consideration

## 2014-12-24 NOTE — Progress Notes (Signed)
Alicia Summers  [id number] 09/20/1955 12/24/2014      Progress Note-Follow Up  Subjective  Chief Complaint  Chief Complaint  Patient presents with  . Establish Care    HPI  Patient is a 59 y.o. female in today for routine medical care. She is in today to establish care as her previous primary care doctor has left practice. Overall she feels well. She does have a past medical history significant for dyspepsia, hyperlipidemia, allergies and. No recent illness or acute complaints. Denies CP/palp/SOB/HA/congestion/fevers or GU c/o. Taking meds as prescribed  Past Medical History  Diagnosis Date  . Hyperlipidemia   . Vaginal atrophy   . History of chicken pox 12/12/2014    Past Surgical History  Procedure Laterality Date  . Breast surgery      implants in 2003  . Abdominal hysterectomy      b/l ovaries intact  . Tubal ligation      Family History  Problem Relation Age of Onset  . COPD Mother   . Cancer Father 3    prostate  . Cancer Maternal Grandmother 63    colon, lung  . Cancer Paternal Grandfather     colon  . Alcohol abuse Sister   . Cirrhosis Sister     History   Social History  . Marital Status: Married    Spouse Name: N/A  . Number of Children: N/A  . Years of Education: N/A   Occupational History  . Not on file.   Social History Main Topics  . Smoking status: Never Smoker   . Smokeless tobacco: Never Used  . Alcohol Use: No     Comment: socially   . Drug Use: No  . 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 avoid dairy   Other Topics Concern  . Not on file   Social History Narrative    Current Outpatient Prescriptions on File Prior to Visit  Medication Sig Dispense Refill  . estradiol (ESTRACE) 0.1 MG/GM vaginal cream Place 1 Applicatorful vaginally at bedtime.    . timolol (BETIMOL) 0.25 % ophthalmic solution 1-2  drops 2 (two) times daily.    . Cetirizine HCl (ZYRTEC ALLERGY) 10 MG CAPS Take by mouth.    . isometheptene-acetaminophen-dichloralphenazone (MIDRIN) 65-325-100 MG capsule Take 1 capsule by mouth 4 (four) times daily as needed for migraine. Maximum 5 capsules in 12 hours for migraine headaches, 8 capsules in 24 hours for tension headaches.     No current facility-administered medications on file prior to visit.    Allergies  Allergen Reactions  . Dilaudid [Hydromorphone Hcl] Itching    Review of Systems  Review of Systems  Constitutional: Negative for fever, chills and malaise/fatigue.  HENT: Negative for congestion, hearing loss and nosebleeds.   Eyes: Negative for discharge.  Respiratory: Negative for cough, sputum production, shortness of breath and wheezing.   Cardiovascular: Negative for chest pain, palpitations and leg swelling.  Gastrointestinal: Positive for heartburn and abdominal pain. Negative for nausea, vomiting, diarrhea, constipation and blood in stool.  Genitourinary: Negative for dysuria, urgency, frequency and hematuria.  Musculoskeletal: Negative for myalgias, back pain and falls.  Skin: Negative for rash.  Neurological: Negative for dizziness, tremors, sensory change, focal weakness, loss of consciousness, weakness and headaches.  Endo/Heme/Allergies: Negative for polydipsia. Does not bruise/bleed easily.  Psychiatric/Behavioral: Negative for depression and suicidal ideas. The patient is not nervous/anxious  and does not have insomnia.     Objective  BP 122/82 mmHg  Pulse 74  Temp(Src) 98.5 F (36.9 C) (Oral)  Ht 5\' 2"  (1.575 m)  Wt 142 lb 8 oz (64.638 kg)  BMI 26.06 kg/m2  SpO2 97%  LMP 10/18/1995  Physical Exam  Physical Exam  Constitutional: She is oriented to person, place, and time and well-developed, well-nourished, and in no distress. No distress.  HENT:  Head: Normocephalic and atraumatic.  Right Ear: External ear normal.  Left Ear: External  ear normal.  Nose: Nose normal.  Mouth/Throat: Oropharynx is clear and moist. No oropharyngeal exudate.  Oropharynx erythematous  Eyes: Conjunctivae are normal. Pupils are equal, round, and reactive to light. Right eye exhibits no discharge. Left eye exhibits no discharge. No scleral icterus.  Neck: Normal range of motion. Neck supple. No thyromegaly present.  Cardiovascular: Normal rate, regular rhythm, normal heart sounds and intact distal pulses.   No murmur heard. Pulmonary/Chest: Effort normal and breath sounds normal. No respiratory distress. She has no wheezes. She has no rales.  Abdominal: Soft. Bowel sounds are normal. She exhibits no distension and no mass. There is no tenderness.  Musculoskeletal: Normal range of motion. She exhibits no edema or tenderness.  Lymphadenopathy:    She has no cervical adenopathy.  Neurological: She is alert and oriented to person, place, and time. She has normal reflexes. No cranial nerve deficit. Coordination normal.  Skin: Skin is warm and dry. No rash noted. She is not diaphoretic.  Psychiatric: Mood, memory and affect normal.    Lab Results  Component Value Date   TSH 1.82 12/18/2014   Lab Results  Component Value Date   WBC 4.2 12/18/2014   HGB 13.2 12/18/2014   HCT 38.8 12/18/2014   MCV 90.6 12/18/2014   PLT 228.0 12/18/2014   Lab Results  Component Value Date   CREATININE 0.73 12/18/2014   BUN 17 12/18/2014   NA 139 12/18/2014   K 3.9 12/18/2014   CL 106 12/18/2014   CO2 29 12/18/2014   Lab Results  Component Value Date   ALT 18 12/18/2014   AST 22 12/18/2014   ALKPHOS 49 12/18/2014   BILITOT 0.5 12/18/2014   Lab Results  Component Value Date   CHOL 154 12/18/2014   Lab Results  Component Value Date   HDL 64.90 12/18/2014   Lab Results  Component Value Date   LDLCALC 78 12/18/2014   Lab Results  Component Value Date   TRIG 58.0 12/18/2014   Lab Results  Component Value Date   CHOLHDL 2 12/18/2014      Assessment & Plan  Menopausal hot flushes Small, frequent meals with lean proteins and complex carbs (use the size of fist) 6-8 hours sleep MegaRed krill oil cap daily abySchiff Exercise daily but not before bed No blue-green glowing lights 1 hour before bedtime Icool daily can get at [employer]  Hyperlipidemia Tolerating statin, encouraged heart healthy diet, avoid trans fats, minimize simple carbs and saturated fats. Increase exercise as tolerated  Allergic rhinitis Uses Cetirizine with some results. May add Flonase as needed  Screen for colon cancer Has not had colonoscopy to date, agrees to referral after long discussion  Dyspepsia Previous imaging and work up unremarkable, limited sensitivity testing today reveals reactivity to numerous foods, including wheat, beef and eggs encouraged to avoid these foods and started on a probiotic, referred to GI for further consideration

## 2015-01-01 ENCOUNTER — Telehealth: Payer: Self-pay | Admitting: *Deleted

## 2015-01-01 NOTE — Telephone Encounter (Signed)
Medical records received via fax from [place]. Forwarded to Dr. Charlett Blake. JG//CMA

## 2015-01-11 ENCOUNTER — Encounter: Payer: Self-pay | Admitting: Gastroenterology

## 2015-01-12 ENCOUNTER — Other Ambulatory Visit: Payer: Self-pay | Admitting: Urology

## 2015-01-22 ENCOUNTER — Ambulatory Visit (INDEPENDENT_AMBULATORY_CARE_PROVIDER_SITE_OTHER): Payer: Commercial Managed Care - HMO | Admitting: Physician Assistant

## 2015-01-22 ENCOUNTER — Encounter: Payer: Self-pay | Admitting: Physician Assistant

## 2015-01-22 VITALS — BP 156/80 | HR 71 | Temp 98.4°F | Ht 62.0 in | Wt 142.0 lb

## 2015-01-22 DIAGNOSIS — J019 Acute sinusitis, unspecified: Secondary | ICD-10-CM | POA: Diagnosis not present

## 2015-01-22 DIAGNOSIS — B9689 Other specified bacterial agents as the cause of diseases classified elsewhere: Secondary | ICD-10-CM | POA: Insufficient documentation

## 2015-01-22 MED ORDER — AMOXICILLIN-POT CLAVULANATE 875-125 MG PO TABS: 1.0000 | ORAL_TABLET | Freq: Two times a day (BID) | ORAL | Status: DC

## 2015-01-22 MED ORDER — FLUCONAZOLE 150 MG PO TABS: 150.0000 mg | ORAL_TABLET | Freq: Once | ORAL | Status: DC

## 2015-01-22 NOTE — Patient Instructions (Signed)
Please take antibiotic as directed.  Increase fluid intake.  Use Saline nasal spray.  Take a daily multivitamin. Use Mucinex-DM for cough and to breakup mucous.  Place a humidifier in the bedroom.  Please call or return clinic if symptoms are not improving. (I have sent in a Diflucan to take if antibiotics cause yeast infection)  Sinusitis Sinusitis is redness, soreness, and swelling (inflammation) of the paranasal sinuses. Paranasal sinuses are air pockets within the bones of your face (beneath the eyes, the middle of the forehead, or above the eyes). In healthy paranasal sinuses, mucus is able to drain out, and air is able to circulate through them by way of your nose. However, when your paranasal sinuses are inflamed, mucus and air can become trapped. This can allow bacteria and other germs to grow and cause infection. Sinusitis can develop quickly and last only a short time (acute) or continue over a long period (chronic). Sinusitis that lasts for more than 12 weeks is considered chronic.  CAUSES  Causes of sinusitis include:  Allergies.  Structural abnormalities, such as displacement of the cartilage that separates your nostrils (deviated septum), which can decrease the air flow through your nose and sinuses and affect sinus drainage.  Functional abnormalities, such as when the small hairs (cilia) that line your sinuses and help remove mucus do not work properly or are not present. SYMPTOMS  Symptoms of acute and chronic sinusitis are the same. The primary symptoms are pain and pressure around the affected sinuses. Other symptoms include:  Upper toothache.  Earache.  Headache.  Bad breath.  Decreased sense of smell and taste.  A cough, which worsens when you are lying flat.  Fatigue.  Fever.  Thick drainage from your nose, which often is green and may contain pus (purulent).  Swelling and warmth over the affected sinuses. DIAGNOSIS  Your caregiver will perform a physical  exam. During the exam, your caregiver may:  Look in your nose for signs of abnormal growths in your nostrils (nasal polyps).  Tap over the affected sinus to check for signs of infection.  View the inside of your sinuses (endoscopy) with a special imaging device with a light attached (endoscope), which is inserted into your sinuses. If your caregiver suspects that you have chronic sinusitis, one or more of the following tests may be recommended:  Allergy tests.  Nasal culture A sample of mucus is taken from your nose and sent to a lab and screened for bacteria.  Nasal cytology A sample of mucus is taken from your nose and examined by your caregiver to determine if your sinusitis is related to an allergy. TREATMENT  Most cases of acute sinusitis are related to a viral infection and will resolve on their own within 10 days. Sometimes medicines are prescribed to help relieve symptoms (pain medicine, decongestants, nasal steroid sprays, or saline sprays).  However, for sinusitis related to a bacterial infection, your caregiver will prescribe antibiotic medicines. These are medicines that will help kill the bacteria causing the infection.  Rarely, sinusitis is caused by a fungal infection. In theses cases, your caregiver will prescribe antifungal medicine. For some cases of chronic sinusitis, surgery is needed. Generally, these are cases in which sinusitis recurs more than 3 times per year, despite other treatments. HOME CARE INSTRUCTIONS   Drink plenty of water. Water helps thin the mucus so your sinuses can drain more easily.  Use a humidifier.  Inhale steam 3 to 4 times a day (for example, sit in  the bathroom with the shower running).  Apply a warm, moist washcloth to your face 3 to 4 times a day, or as directed by your caregiver.  Use saline nasal sprays to help moisten and clean your sinuses.  Take over-the-counter or prescription medicines for pain, discomfort, or fever only as  directed by your caregiver. SEEK IMMEDIATE MEDICAL CARE IF:  You have increasing pain or severe headaches.  You have nausea, vomiting, or drowsiness.  You have swelling around your face.  You have vision problems.  You have a stiff neck.  You have difficulty breathing. MAKE SURE YOU:   Understand these instructions.  Will watch your condition.  Will get help right away if you are not doing well or get worse. Document Released: 06/30/2005 Document Revised: 09/22/2011 Document Reviewed: 07/15/2011 [facility] Patient Information 2014 [place], [place].

## 2015-01-22 NOTE — Progress Notes (Signed)
Pre visit review using our clinic review tool, if applicable. No additional management support is needed unless otherwise documented below in the visit note. 

## 2015-01-22 NOTE — Progress Notes (Signed)
Patient presents to clinic today c/o chest congestion, sinus pressure/sinus pain, ear pain, productive cough that has been present and rapidly worsening x 6 days. Denies fever, chills or chest pain. Has been taking Sudafed for congestion. Has also been taking Mucinex and tylenol with little relief.   Past Medical History  Diagnosis Date  . Hyperlipidemia   . Vaginal atrophy   . History of chicken pox 12/12/2014    Current Outpatient Prescriptions on File Prior to Visit  Medication Sig Dispense Refill  . Cetirizine HCl (ZYRTEC ALLERGY) 10 MG CAPS Take by mouth.    . estradiol (ESTRACE) 0.1 MG/GM vaginal cream Place 1 Applicatorful vaginally at bedtime.    . rosuvastatin (CRESTOR) 10 MG tablet Take 1 tablet (10 mg total) by mouth daily. 90 tablet 1  . timolol (BETIMOL) 0.25 % ophthalmic solution 1-2 drops 2 (two) times daily.    Marland Kitchen isometheptene-acetaminophen-dichloralphenazone (MIDRIN) 65-325-100 MG capsule Take 1 capsule by mouth 4 (four) times daily as needed for migraine. Maximum 5 capsules in 12 hours for migraine headaches, 8 capsules in 24 hours for tension headaches.     No current facility-administered medications on file prior to visit.    Allergies  Allergen Reactions  . Dilaudid [Hydromorphone Hcl] Itching    Family History  Problem Relation Age of Onset  . COPD Mother   . Cancer Father 90    prostate  . Cancer Maternal Grandmother 65    colon, lung  . Cancer Paternal Grandfather     colon  . Alcohol abuse Sister   . Cirrhosis Sister     History   Social History  . Marital Status: Married    Spouse Name: N/A  . Number of Children: N/A  . Years of Education: N/A   Social History Main Topics  . Smoking status: Never Smoker   . Smokeless tobacco: Never Used  . Alcohol Use: No     Comment: socially   . Drug Use: No  . 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 None   Social History Narrative   Review of Systems - See HPI.  All other ROS are negative.  BP 156/80 mmHg  Pulse 71  Temp(Src) 98.4 F (36.9 C) (Oral)  Ht $R'5\' 2"'Yl$  (1.575 m)  Wt 142 lb (64.411 kg)  BMI 25.97 kg/m2  SpO2 100%  LMP 10/18/1995  Physical Exam  Recent Results (from the past 2160 hour(s))  Comp Met (CMET)     Status: None   Collection Time: 12/18/14  7:35 AM  Result Value Ref Range   Sodium 139 135 - 145 mEq/L   Potassium 3.9 3.5 - 5.1 mEq/L   Chloride 106 96 - 112 mEq/L   CO2 29 19 - 32 mEq/L   Glucose, Bld 78 70 - 99 mg/dL   BUN 17 6 - 23 mg/dL   Creatinine, Ser 0.73 0.40 - 1.20 mg/dL   Total Bilirubin 0.5 0.2 - 1.2 mg/dL   Alkaline Phosphatase 49 39 - 117 U/L   AST 22 0 - 37 U/L   ALT 18 0 - 35 U/L   Total Protein 6.9 6.0 - 8.3 g/dL   Albumin 4.4 3.5 - 5.2 g/dL   Calcium 9.3 8.4 - 10.5 mg/dL   GFR 86.43 >60.00 mL/min  CBC     Status:  None   Collection Time: 12/18/14  7:35 AM  Result Value Ref Range   WBC 4.2 4.0 - 10.5 K/uL   RBC 4.28 3.87 - 5.11 Mil/uL   Platelets 228.0 150.0 - 400.0 K/uL   Hemoglobin 13.2 12.0 - 15.0 g/dL   HCT 38.8 36.0 - 46.0 %   MCV 90.6 78.0 - 100.0 fl   MCHC 34.1 30.0 - 36.0 g/dL   RDW 12.7 11.5 - 15.5 %  IgG food panel     Status: Abnormal   Collection Time: 12/18/14  7:35 AM  Result Value Ref Range   Beef, IgG 6.6 (H) <2.0 mcg/mL    Comment:  This test(s) was performed using a kit that has not been cleared  or approved by the FDA. The analytical performance  characteristics of this test have been determined by [facility], [place], [place]. This test, and any  food specific allergen IgG result, should not be used for the  diagnosis of allergic or atopic disease states (except for  sensitivity to milk in neonates and gluten sensitivity). The use  of food specific allergen IgG results should be restricted to the  assessment of  response to therapeutic interventions.    Egg yolk, IgG 5.9 (H) <2.0 mcg/mL    Comment:  This test(s) was performed using a kit that has not been cleared  or approved by the FDA. The analytical performance  characteristics of this test have been determined by [facility], [place], [place]. This test, and any  food specific allergen IgG result, should not be used for the  diagnosis of allergic or atopic disease states (except for  sensitivity to milk in neonates and gluten sensitivity). The use  of food specific allergen IgG results should be restricted to the  assessment of response to therapeutic interventions.    Allergen, Milk, IgG 8.84 (H) <0.15 mcg/mL   Peanut, IgG <0.15 <0.15 mcg/mL   Wheat, IgG <0.15 <0.15 mcg/mL    Comment: The reference range listed on the report is the lower limit of  quantitation for the assay. The clinical utility of food-specific  IgG4 tests has not been clearly established. These tests can be used  in special clinical situations to select foods for evaluation by diet  elimination and challenge in patients who have food-related  complaints, and to evaluate food allergic patients prior to food  challenges.The presence of food-specific IgG4 alone cannot be taken  as evidence of food allergy and only indicates immunologic  sensitization to the food allergen in question. *This test was developed and its performance characteristics  determined by [facility]. It has not been cleared or  approved by the U.S. Food and Drug Administration.    Chicken, IgG <0.15 <0.15 mcg/mL   Corn, IgG <0.15 <0.15 mcg/mL   Egg white, IgG 7.7 (H) <2.0 mcg/mL    Comment:  This test(s) was performed using a kit that has not been cleared  or approved by the FDA. The analytical performance  characteristics of this test have been determined by [facility], [place], [place]. This test, and any  food specific allergen IgG result,  should not be used for the  diagnosis of allergic or atopic disease states (except for  sensitivity to milk in neonates and gluten sensitivity). The use  of food specific allergen IgG results should be restricted to the  assessment of response to therapeutic interventions.   Lipid panel     Status: None  Collection Time: 12/18/14  7:35 AM  Result Value Ref Range   Cholesterol 154 0 - 200 mg/dL    Comment: ATP III Classification       Desirable:  < 200 mg/dL               Borderline High:  200 - 239 mg/dL          High:  > = 240 mg/dL   Triglycerides 58.0 0.0 - 149.0 mg/dL    Comment: Normal:  <150 mg/dLBorderline High:  150 - 199 mg/dL   HDL 64.90 >39.00 mg/dL   VLDL 11.6 0.0 - 40.0 mg/dL   LDL Cholesterol 78 0 - 99 mg/dL   Total CHOL/HDL Ratio 2     Comment:                Men          Women1/2 Average Risk     3.4          3.3Average Risk          5.0          4.42X Average Risk          9.6          7.13X Average Risk          15.0          11.0                       NonHDL 89.10     Comment: NOTE:  Non-HDL goal should be 30 mg/dL higher than patient's LDL goal (i.e. LDL goal of < 70 mg/dL, would have non-HDL goal of < 100 mg/dL)  TSH     Status: None   Collection Time: 12/18/14  7:35 AM  Result Value Ref Range   TSH 1.82 0.35 - 4.50 uIU/mL    Assessment/Plan: Acute bacterial sinusitis Rx Augmentin.  Increase fluids.  Rest.  Saline nasal spray.  Probiotic.  Mucinex as directed.  Humidifier in bedroom.  Call or return to clinic if symptoms are not improving.

## 2015-01-22 NOTE — Assessment & Plan Note (Signed)
Rx Augmentin.  Increase fluids.  Rest.  Saline nasal spray.  Probiotic.  Mucinex as directed.  Humidifier in bedroom.  Call or return to clinic if symptoms are not improving.  

## 2015-01-23 ENCOUNTER — Encounter (HOSPITAL_BASED_OUTPATIENT_CLINIC_OR_DEPARTMENT_OTHER): Payer: Self-pay | Admitting: *Deleted

## 2015-01-23 NOTE — Progress Notes (Signed)
NPO AFTER MN.  ARRIVE AT 0900.  NEEDS HG.  WILL TAKE ZYRTEC AM DOS W/ SIPS OF WATER.

## 2015-01-24 ENCOUNTER — Ambulatory Visit (HOSPITAL_BASED_OUTPATIENT_CLINIC_OR_DEPARTMENT_OTHER)
Admission: RE | Admit: 2015-01-24 | Discharge: 2015-01-24 | Disposition: A | Discharge status: Home / Self Care | Payer: Commercial Managed Care - HMO | Source: Ambulatory Visit | Attending: Urology | Admitting: Urology

## 2015-01-24 ENCOUNTER — Encounter (HOSPITAL_BASED_OUTPATIENT_CLINIC_OR_DEPARTMENT_OTHER): Payer: Self-pay

## 2015-01-24 ENCOUNTER — Ambulatory Visit (HOSPITAL_BASED_OUTPATIENT_CLINIC_OR_DEPARTMENT_OTHER): Payer: Commercial Managed Care - HMO | Admitting: Anesthesiology

## 2015-01-24 ENCOUNTER — Encounter (HOSPITAL_BASED_OUTPATIENT_CLINIC_OR_DEPARTMENT_OTHER)
Admission: RE | Disposition: A | Discharge status: Home / Self Care | Payer: Self-pay | Source: Ambulatory Visit | Attending: Urology

## 2015-01-24 DIAGNOSIS — Z79890 Hormone replacement therapy (postmenopausal): Secondary | ICD-10-CM | POA: Diagnosis not present

## 2015-01-24 DIAGNOSIS — Z79899 Other long term (current) drug therapy: Secondary | ICD-10-CM | POA: Diagnosis not present

## 2015-01-24 DIAGNOSIS — E785 Hyperlipidemia, unspecified: Secondary | ICD-10-CM | POA: Diagnosis not present

## 2015-01-24 DIAGNOSIS — H409 Unspecified glaucoma: Secondary | ICD-10-CM | POA: Diagnosis not present

## 2015-01-24 DIAGNOSIS — N1330 Unspecified hydronephrosis: Secondary | ICD-10-CM | POA: Insufficient documentation

## 2015-01-24 DIAGNOSIS — M1990 Unspecified osteoarthritis, unspecified site: Secondary | ICD-10-CM | POA: Insufficient documentation

## 2015-01-24 DIAGNOSIS — G43909 Migraine, unspecified, not intractable, without status migrainosus: Secondary | ICD-10-CM | POA: Diagnosis not present

## 2015-01-24 HISTORY — DX: Preglaucoma, unspecified, unspecified eye: H40.009

## 2015-01-24 HISTORY — DX: Unspecified hydronephrosis: N13.30

## 2015-01-24 HISTORY — DX: Other specified bacterial agents as the cause of diseases classified elsewhere: B96.89

## 2015-01-24 HISTORY — DX: Personal history of colon polyps, unspecified: Z86.0100

## 2015-01-24 HISTORY — PX: CYSTOSCOPY WITH RETROGRADE PYELOGRAM, URETEROSCOPY AND STENT PLACEMENT: SHX5789

## 2015-01-24 HISTORY — DX: Personal history of colonic polyps: Z86.010

## 2015-01-24 HISTORY — DX: Unspecified osteoarthritis, unspecified site: M19.90

## 2015-01-24 HISTORY — DX: Acute sinusitis, unspecified: J01.90

## 2015-01-24 HISTORY — DX: Migraine, unspecified, not intractable, without status migrainosus: G43.909

## 2015-01-24 HISTORY — DX: Unspecified abdominal pain: R10.9

## 2015-01-24 HISTORY — DX: Presence of spectacles and contact lenses: Z97.3

## 2015-01-24 LAB — POCT HEMOGLOBIN-HEMACUE: Hemoglobin: 14 g/dL (ref 12.0–15.0)

## 2015-01-24 SURGERY — CYSTOURETEROSCOPY, WITH RETROGRADE PYELOGRAM AND STENT INSERTION
Anesthesia: General | Site: Ureter | Laterality: Right

## 2015-01-24 MED ORDER — SODIUM CHLORIDE 0.9 % IR SOLN
Status: DC | PRN
  Administered 2015-01-24: 4000 mL

## 2015-01-24 MED ORDER — MIDAZOLAM HCL 2 MG/2ML IJ SOLN
INTRAMUSCULAR | Status: AC
  Filled 2015-01-24: qty 2

## 2015-01-24 MED ORDER — CEFAZOLIN SODIUM-DEXTROSE 2-3 GM-% IV SOLR
INTRAVENOUS | Status: AC
  Filled 2015-01-24: qty 50

## 2015-01-24 MED ORDER — LIDOCAINE HCL (CARDIAC) 20 MG/ML IV SOLN
INTRAVENOUS | Status: DC | PRN
Start: 2015-01-24 — End: 2015-01-24
  Administered 2015-01-24: 60 mg via INTRAVENOUS

## 2015-01-24 MED ORDER — CEFAZOLIN SODIUM-DEXTROSE 2-3 GM-% IV SOLR
2.0000 g | INTRAVENOUS | Status: AC
  Administered 2015-01-24: 2 g via INTRAVENOUS
  Filled 2015-01-24: qty 50

## 2015-01-24 MED ORDER — PROMETHAZINE HCL 25 MG/ML IJ SOLN
6.2500 mg | INTRAMUSCULAR | Status: DC | PRN
  Filled 2015-01-24: qty 1

## 2015-01-24 MED ORDER — FENTANYL CITRATE (PF) 100 MCG/2ML IJ SOLN
25.0000 ug | INTRAMUSCULAR | Status: DC | PRN
  Filled 2015-01-24: qty 1

## 2015-01-24 MED ORDER — MIDAZOLAM HCL 5 MG/5ML IJ SOLN
INTRAMUSCULAR | Status: DC | PRN
  Administered 2015-01-24: 2 mg via INTRAVENOUS

## 2015-01-24 MED ORDER — PROPOFOL 10 MG/ML IV BOLUS
INTRAVENOUS | Status: DC | PRN
  Administered 2015-01-24: 160 mg via INTRAVENOUS

## 2015-01-24 MED ORDER — ONDANSETRON HCL 4 MG/2ML IJ SOLN
INTRAMUSCULAR | Status: DC | PRN
  Administered 2015-01-24: 4 mg via INTRAVENOUS

## 2015-01-24 MED ORDER — FENTANYL CITRATE (PF) 100 MCG/2ML IJ SOLN
INTRAMUSCULAR | Status: AC
  Filled 2015-01-24: qty 4

## 2015-01-24 MED ORDER — FENTANYL CITRATE (PF) 100 MCG/2ML IJ SOLN
INTRAMUSCULAR | Status: DC | PRN
  Administered 2015-01-24: 100 ug via INTRAVENOUS

## 2015-01-24 MED ORDER — LACTATED RINGERS IV SOLN
INTRAVENOUS | Status: DC
  Filled 2015-01-24: qty 1000

## 2015-01-24 MED ORDER — TRAMADOL HCL 50 MG PO TABS: 50.0000 mg | ORAL_TABLET | Freq: Four times a day (QID) | ORAL | Status: DC | PRN

## 2015-01-24 MED ORDER — MEPERIDINE HCL 25 MG/ML IJ SOLN
6.2500 mg | INTRAMUSCULAR | Status: DC | PRN
  Filled 2015-01-24: qty 1

## 2015-01-24 MED ORDER — EPHEDRINE SULFATE 50 MG/ML IJ SOLN
INTRAMUSCULAR | Status: DC | PRN
  Administered 2015-01-24: 10 mg via INTRAVENOUS

## 2015-01-24 MED ORDER — LACTATED RINGERS IV SOLN
INTRAVENOUS | Status: DC
Start: 2015-01-24 — End: 2015-01-24
  Administered 2015-01-24 (×2): via INTRAVENOUS
  Filled 2015-01-24: qty 1000

## 2015-01-24 MED ORDER — CEFAZOLIN SODIUM 1-5 GM-% IV SOLN
1.0000 g | INTRAVENOUS | Status: DC
  Filled 2015-01-24: qty 50

## 2015-01-24 MED ORDER — IOHEXOL 300 MG/ML  SOLN
INTRAMUSCULAR | Status: DC | PRN
  Administered 2015-01-24: 2 mL via URETHRAL

## 2015-01-24 MED ORDER — DEXAMETHASONE SODIUM PHOSPHATE 4 MG/ML IJ SOLN
INTRAMUSCULAR | Status: DC | PRN
  Administered 2015-01-24: 10 mg via INTRAVENOUS

## 2015-01-24 SURGICAL SUPPLY — 26 items
BAG URO CATCHER STRL LF (DRAPE) ×2 IMPLANT
BASKET LASER NITINOL 1.9FR (BASKET) IMPLANT
BASKET STONE 1.7 NGAGE (UROLOGICAL SUPPLIES) IMPLANT
BASKET ZERO TIP NITINOL 2.4FR (BASKET) IMPLANT
CANISTER SUCT LVC 12 LTR MEDI- (MISCELLANEOUS) IMPLANT
CATH INTERMIT  6FR 70CM (CATHETERS) ×2 IMPLANT
CATH URET 5FR 28IN OPEN ENDED (CATHETERS) IMPLANT
CLOTH BEACON ORANGE TIMEOUT ST (SAFETY) ×2 IMPLANT
FIBER LASER FLEXIVA 365 (UROLOGICAL SUPPLIES) IMPLANT
FIBER LASER TRAC TIP (UROLOGICAL SUPPLIES) IMPLANT
GLOVE BIO SURGEON STRL SZ 6.5 (GLOVE) ×2 IMPLANT
GLOVE BIO SURGEON STRL SZ8 (GLOVE) ×2 IMPLANT
GLOVE INDICATOR 6.5 STRL GRN (GLOVE) ×2 IMPLANT
GLOVE SURG SS PI 7.5 STRL IVOR (GLOVE) ×2 IMPLANT
GOWN STRL REUS W/ TWL LRG LVL3 (GOWN DISPOSABLE) ×2 IMPLANT
GOWN STRL REUS W/ TWL XL LVL3 (GOWN DISPOSABLE) ×1 IMPLANT
GOWN STRL REUS W/TWL LRG LVL3 (GOWN DISPOSABLE) ×2
GOWN STRL REUS W/TWL XL LVL3 (GOWN DISPOSABLE) ×1
GUIDEWIRE ANG ZIPWIRE 038X150 (WIRE) ×2 IMPLANT
GUIDEWIRE STR DUAL SENSOR (WIRE) ×2 IMPLANT
IV NS IRRIG 3000ML ARTHROMATIC (IV SOLUTION) ×2 IMPLANT
MANIFOLD NEPTUNE II (INSTRUMENTS) IMPLANT
PACK CYSTO (CUSTOM PROCEDURE TRAY) ×2 IMPLANT
STENT URET 6FRX26 CONTOUR (STENTS) IMPLANT
SYRINGE 10CC LL (SYRINGE) ×2 IMPLANT
TUBE FEEDING 8FR 16IN STR KANG (MISCELLANEOUS) IMPLANT

## 2015-01-24 NOTE — Anesthesia Preprocedure Evaluation (Addendum)
Anesthesia Evaluation  Patient identified by MRN, date of birth, ID band Patient awake    Reviewed: Allergy & Precautions, NPO status , Patient's Chart, lab work & pertinent test results  Airway Mallampati: II  TM Distance: >3 FB Neck ROM: Full    Dental no notable dental hx.    Pulmonary neg pulmonary ROS,  breath sounds clear to auscultation  Pulmonary exam normal       Cardiovascular negative cardio ROS Normal cardiovascular examRhythm:Regular Rate:Normal     Neuro/Psych negative neurological ROS  negative psych ROS   GI/Hepatic negative GI ROS, Neg liver ROS,   Endo/Other  negative endocrine ROS  Renal/GU negative Renal ROS  negative genitourinary   Musculoskeletal negative musculoskeletal ROS (+)   Abdominal   Peds negative pediatric ROS (+)  Hematology negative hematology ROS (+)   Anesthesia Other Findings   Reproductive/Obstetrics negative OB ROS                             Anesthesia Physical Anesthesia Plan  ASA: I  Anesthesia Plan: General   Post-op Pain Management:    Induction: Intravenous  Airway Management Planned: LMA  Additional Equipment:   Intra-op Plan:   Post-operative Plan: Extubation in OR  Informed Consent: I have reviewed the patients History and Physical, chart, labs and discussed the procedure including the risks, benefits and alternatives for the proposed anesthesia with the patient or authorized representative who has indicated his/her understanding and acceptance.   Dental advisory given  Plan Discussed with: CRNA  Anesthesia Plan Comments:         Anesthesia Quick Evaluation

## 2015-01-24 NOTE — H&P (Signed)
Urology Admission H&P  Chief Complaint: right flank pain  History of Present Illness: Alicia Summers is a 59yo with hx of right mild hydronephrosis who presents today for diagnostic ureteroscopy. She has mild intermittent right flank pain  Past Medical History  Diagnosis Date  . Hyperlipidemia   . History of colon polyps   . Hydronephrosis, right   . Acute bacterial sinusitis     dx 01-22-2015  . Migraine   . Arthritis   . Right flank pain     intermittant  . Borderline glaucoma     bilatera eyes  . Wears glasses    Past Surgical History  Procedure Laterality Date  . Breast enhancement surgery Bilateral 2003  . Colonoscopy  03-04-2007  . Tubal ligation    . Abdominal hysterectomy   1997    w/ Bladder suspension    Home Medications:  Prescriptions prior to admission  Medication Sig Dispense Refill Last Dose  . acetaminophen (TYLENOL) 500 MG tablet Take 1,000 mg by mouth every 6 (six) hours as needed.   01/23/2015 at 1200  . amoxicillin-clavulanate (AUGMENTIN) 875-125 MG per tablet Take 1 tablet by mouth 2 (two) times daily. 20 tablet 0 01/23/2015 at 1900  . Cetirizine HCl (ZYRTEC ALLERGY) 10 MG CAPS Take 1 capsule by mouth every morning.    01/24/2015 at 0730  . estradiol (ESTRACE) 0.1 MG/GM vaginal cream Place 1 Applicatorful vaginally at bedtime.   01/21/2015 at 2100  . rosuvastatin (CRESTOR) 10 MG tablet Take 1 tablet (10 mg total) by mouth daily. (Patient taking differently: Take 10 mg by mouth every evening. ) 90 tablet 1 01/23/2015 at 1000  . timolol (BETIMOL) 0.25 % ophthalmic solution Place 1-2 drops into both eyes 2 (two) times daily.    01/24/2015 at 0730  . fluconazole (DIFLUCAN) 150 MG tablet Take 1 tablet (150 mg total) by mouth once. 1 tablet 0 More than a month at Unknown time  . isometheptene-acetaminophen-dichloralphenazone (MIDRIN) 65-325-100 MG capsule Take 1 capsule by mouth 4 (four) times daily as needed for migraine. Maximum 5 capsules in 12 hours for migraine  headaches, 8 capsules in 24 hours for tension headaches.   Unknown at Unknown time   Allergies:  Allergies  Allergen Reactions  . Dilaudid [Hydromorphone Hcl] Itching    Family History  Problem Relation Age of Onset  . COPD Mother   . Cancer Father 13    prostate  . Cancer Maternal Grandmother 63    colon, lung  . Cancer Paternal Grandfather     colon  . Alcohol abuse Sister   . Cirrhosis Sister    Social History:  reports that she has never smoked. She has never used smokeless tobacco. She reports that she drinks alcohol. She reports that she does not use illicit drugs.  Review of Systems  Genitourinary: Positive for flank pain.  All other systems reviewed and are negative.   Physical Exam:  Vital signs in last 24 hours: Temp:  [98.3 F (36.8 C)] 98.3 F (36.8 C) (07/13 0936) Pulse Rate:  [71] 71 (07/13 0936) Resp:  [14] 14 (07/13 0936) BP: (132)/(85) 132/85 mmHg (07/13 0936) SpO2:  [99 %] 99 % (07/13 0936) Weight:  [63.277 kg (139 lb 8 oz)] 63.277 kg (139 lb 8 oz) (07/13 0936) Physical Exam  Constitutional: She is oriented to person, place, and time. She appears well-developed and well-nourished.  HENT:  Head: Normocephalic and atraumatic.  Eyes: EOM are normal. Pupils are equal, round, and reactive to light.  Neck: Normal range of motion. Neck supple.  Cardiovascular: Normal rate and regular rhythm.   Respiratory: Effort normal and breath sounds normal.  GI: Soft. She exhibits no distension. There is no tenderness.  Musculoskeletal: Normal range of motion.  Neurological: She is alert and oriented to person, place, and time.  Skin: Skin is warm and dry.  Psychiatric: She has a normal mood and affect. Her behavior is normal. Judgment and thought content normal.    Laboratory Data:  Results for orders placed or performed during the hospital encounter of 01/24/15 (from the past 24 hour(s))  Hemoglobin-hemacue, POC     Status: None   Collection Time: 01/24/15  10:31 AM  Result Value Ref Range   Hemoglobin 14.0 12.0 - 15.0 g/dL   No results found for this or any previous visit (from the past 240 hour(s)). Creatinine: No results for input(s): CREATININE in the last 168 hours.   Impression/Assessment:  Right hydronephrosis  Plan:  Risks/benefits/alternatives to right diagnostic ureteroscopy was explained to the patient and she understands and wishes to proceed with surgery  Gratia Disla L 01/24/2015, 12:08 PM

## 2015-01-24 NOTE — Transfer of Care (Signed)
Immediate Anesthesia Transfer of Care Note  Patient: Alicia Summers  Procedure(s) Performed: Procedure(s) (LRB): CYSTOSCOPY WITH RIGHT RETROGRADE PYELOGRAM, DIAGNOSTIC URETEROSCOPY  (Right)  Patient Location: PACU  Anesthesia Type: General  Level of Consciousness: awake, sedated, patient cooperative and responds to stimulation  Airway & Oxygen Therapy: Patient Spontanous Breathing and Patient connected to face mask oxygen  Post-op Assessment: Report given to PACU RN, Post -op Vital signs reviewed and stable and Patient moving all extremities  Post vital signs: Reviewed and stable  Complications: No apparent anesthesia complications

## 2015-01-24 NOTE — Anesthesia Postprocedure Evaluation (Signed)
  Anesthesia Post-op Note  Patient: Alicia Summers  Procedure(s) Performed: Procedure(s) (LRB): CYSTOSCOPY WITH RIGHT RETROGRADE PYELOGRAM, DIAGNOSTIC URETEROSCOPY  (Right)  Patient Location: PACU  Anesthesia Type: General  Level of Consciousness: awake and alert   Airway and Oxygen Therapy: Patient Spontanous Breathing  Post-op Pain: mild  Post-op Assessment: Post-op Vital signs reviewed, Patient's Cardiovascular Status Stable, Respiratory Function Stable, Patent Airway and No signs of Nausea or vomiting  Last Vitals:  Filed Vitals:   01/24/15 1245  BP: 136/64  Pulse: 67  Temp:   Resp: 11    Post-op Vital Signs: stable   Complications: No apparent anesthesia complications

## 2015-01-24 NOTE — Brief Op Note (Signed)
01/24/2015  12:11 PM  PATIENT:  Kathrene Alu  59 y.o. female  PRE-OPERATIVE DIAGNOSIS:  RIGHT HYDRONEPHROSIS  POST-OPERATIVE DIAGNOSIS:  RIGHT HYDRONEPHROSIS  PROCEDURE:  Procedure(s) with comments: CYSTOSCOPY WITH RIGHT RETROGRADE PYELOGRAM, DIAGNOSTIC URETEROSCOPY  (Right) - 83 MIN [id number] [phone]  SURGEON:  Surgeon(s) and Role:    * Cleon Gustin, MD - Primary  PHYSICIAN ASSISTANT:   ASSISTANTS: none   ANESTHESIA:   general  EBL:     BLOOD ADMINISTERED:none  DRAINS: none   LOCAL MEDICATIONS USED:  NONE  SPECIMEN:  No Specimen  DISPOSITION OF SPECIMEN:  N/A  COUNTS:  YES  TOURNIQUET:  * No tourniquets in log *  DICTATION: .Note written in EPIC  PLAN OF CARE: Discharge to home after PACU  PATIENT DISPOSITION:  PACU - hemodynamically stable.   Delay start of Pharmacological VTE agent (>24hrs) due to surgical blood loss or risk of bleeding: not applicable

## 2015-01-24 NOTE — Anesthesia Procedure Notes (Signed)
Procedure Name: LMA Insertion Performed by: Louisa Favaro, [place] Pre-anesthesia Checklist: Patient identified, Emergency Drugs available, Suction available and Patient being monitored Patient Re-evaluated:Patient Re-evaluated prior to inductionOxygen Delivery Method: Circle System Utilized Preoxygenation: Pre-oxygenation with 100% oxygen Intubation Type: IV induction Ventilation: Mask ventilation without difficulty LMA: LMA inserted LMA Size: 4.0 Number of attempts: 1 Airway Equipment and Method: Bite block Placement Confirmation: positive ETCO2 Tube secured with: Tape Dental Injury: Teeth and Oropharynx as per pre-operative assessment      

## 2015-01-24 NOTE — Discharge Instructions (Signed)

## 2015-01-24 NOTE — Op Note (Signed)
.  Preoperative diagnosis: Right hydronephrosis  Postoperative diagnosis: Same  Procedure: 1 cystoscopy 2.  right retrograde pyelography 3.  Intraoperative fluoroscopy, under one hour, with interpretation 4.  Right diagnostic ureteroscopy  Attending: Rosie Fate  Anesthesia: General  Estimated blood loss: None  Drains: none  Specimens: none  Antibiotics: Ancef  Findings: To masses/lesions in urethra or bladder. Ureteral orifices in normal anatomic location. Normal right retrograde pyelogram. No stones or lesions noted in the ureter.  Indications: Patient is a 59 year old female with a history of hydronephrosis.  After discussing treatment options, she decided proceed with right diagnostic ureteroscopy  Procedure her in detail: The patient was brought to the operating room and a brief timeout was done to ensure correct patient, correct procedure, correct site.  General anesthesia was administered patient was placed in dorsal lithotomy position.  Her genitalia was then prepped and draped in usual sterile fashion.  A rigid 21 French cystoscope was passed in the urethra and the bladder.  Bladder was inspected free masses or lesions.  the right ureteral orifices were in the normal orthotopic locations.  a 6 french ureteral catheter was then instilled into the right ureter orifice.  a gentle retrograde was obtained and findings noted above.  we then placed a zip wire through the ureteral catheter and advanced up to the renal pelvis.  we then removed the cystoscope and cannulated the right ureteral orifice with a semirigid ureteroscope.  we then performed ureteroscopy up to the level of the UPJ. No stone or tumor was encountered.  the bladder was then drained and this concluded the procedure which was well tolerated by patient.  Complications: None  Condition: Stable, extubated, transferred to PACU  Plan: Pt is to followup in 2 weeks

## 2015-01-25 ENCOUNTER — Encounter: Payer: Self-pay | Admitting: Physician Assistant

## 2015-01-25 MED ORDER — DOXYCYCLINE HYCLATE 100 MG PO CAPS: 100.0000 mg | ORAL_CAPSULE | Freq: Two times a day (BID) | ORAL | Status: DC

## 2015-01-29 ENCOUNTER — Encounter (HOSPITAL_BASED_OUTPATIENT_CLINIC_OR_DEPARTMENT_OTHER): Payer: Self-pay | Admitting: Urology

## 2015-02-20 ENCOUNTER — Other Ambulatory Visit: Payer: Self-pay

## 2015-02-20 ENCOUNTER — Encounter: Payer: Self-pay | Admitting: Family Medicine

## 2015-02-20 DIAGNOSIS — Z1231 Encounter for screening mammogram for malignant neoplasm of breast: Secondary | ICD-10-CM

## 2015-03-21 ENCOUNTER — Encounter: Payer: Self-pay | Admitting: Gastroenterology

## 2015-03-21 ENCOUNTER — Ambulatory Visit (INDEPENDENT_AMBULATORY_CARE_PROVIDER_SITE_OTHER): Payer: Commercial Managed Care - HMO | Admitting: Gastroenterology

## 2015-03-21 VITALS — BP 132/80 | HR 68 | Ht 62.0 in | Wt 141.4 lb

## 2015-03-21 DIAGNOSIS — R1011 Right upper quadrant pain: Secondary | ICD-10-CM

## 2015-03-21 DIAGNOSIS — R140 Abdominal distension (gaseous): Secondary | ICD-10-CM | POA: Diagnosis not present

## 2015-03-21 MED ORDER — HYOSCYAMINE SULFATE 0.125 MG SL SUBL: SUBLINGUAL_TABLET | SUBLINGUAL | Status: DC

## 2015-03-21 NOTE — Progress Notes (Signed)
    History of Present Illness: This is a 59 year old female referred by Mosie Lukes, MD for the evaluation of right upper quadrant pain and bloating. The patient relates a many year history of frequent postprandial pain in her right upper quadrant, right flank and right back. She cannot clearly determine any specific foods or activities that precipitate symptoms. She did note that when she was on vacation last week she did not have any symptoms. Increased colonic stool was noted by her urologist on imaging and she was advised to use MiraLAX for one month. She is not sure that her symptoms improved. She was previously evaluated at [facility] gastroenterology and underwent upper endoscopy in October 2014 for heartburn and bloating and it was normal. She previously underwent colonoscopy in May 2008 showing mild diverticulosis. She does note intolerance to milk products and avoids them. Denies weight loss, constipation, diarrhea, change in stool caliber, melena, hematochezia, nausea, vomiting, dysphagia, reflux symptoms, chest pain.   Review of Systems: Pertinent positive and negative review of systems were noted in the above HPI section. All other review of systems were otherwise negative.  Current Medications, Allergies, Past Medical History, Past Surgical History, Family History and Social History were reviewed in [employer] record.  Physical Exam: General: Well developed, well nourished, no acute distress Head: Normocephalic and atraumatic Eyes:  sclerae anicteric, EOMI Ears: Normal auditory acuity Mouth: No deformity or lesions Neck: Supple, no masses or thyromegaly Lungs: Clear throughout to auscultation. Mild tenderness over right anterior lower ribs and right posterior lower ribs Heart: Regular rate and rhythm; no murmurs, rubs or bruits Abdomen: Soft, non tender and non distended. No masses, hepatosplenomegaly or hernias noted. Normal Bowel sounds Musculoskeletal:  Symmetrical with no gross deformities  Skin: No lesions on visible extremities Pulses:  Normal pulses noted Extremities: No clubbing, cyanosis, edema or deformities noted Neurological: Alert oriented x 4, grossly nonfocal Cervical Nodes:  No significant cervical adenopathy Inguinal Nodes: No significant inguinal adenopathy Psychological:  Alert and cooperative. Normal mood and affect  Assessment and Recommendations:  1. Right upper quadrant pain, abdominal bloating, tenderness over lower ribs anteriorly and posteriorly. 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 She may also have a component of constipation exacerbating her symptoms. Continue MiraLAX once daily. Gas-X 4 times a day as needed. Begin hyoscyamine 1-2 before meals and every 4 hours as needed. Plan for trial of a PPI and consider CCK if above measures are not helpful. Return office visit 6 weeks.  cc: Mosie Lukes, MD [address] [place], [place] [address]

## 2015-03-21 NOTE — Patient Instructions (Signed)
We have sent the following medications to your pharmacy for you to pick up at your convenience:Levsin.  You can take over the counter Gas-X four times a day as needed for gas and bloating.  Start Miralax over the counter mixing 17 grams in 8 oz of water daily for constipation.  Your follow up appointment with Dr. Fuller Plan is on 05/09/15 at 10:45am.   Thank you for choosing me and [facility].  Pricilla Riffle. Dagoberto Ligas., MD., [facility] Regal

## 2015-04-02 ENCOUNTER — Ambulatory Visit
Admission: RE | Admit: 2015-04-02 | Discharge: 2015-04-02 | Disposition: A | Discharge status: Home / Self Care | Payer: Commercial Managed Care - HMO | Source: Ambulatory Visit

## 2015-04-02 DIAGNOSIS — Z1231 Encounter for screening mammogram for malignant neoplasm of breast: Secondary | ICD-10-CM

## 2015-05-09 ENCOUNTER — Ambulatory Visit: Payer: Commercial Managed Care - HMO | Admitting: Gastroenterology

## 2015-06-11 ENCOUNTER — Encounter: Payer: Self-pay | Admitting: Family Medicine

## 2015-06-12 ENCOUNTER — Other Ambulatory Visit: Payer: Self-pay | Admitting: Family Medicine

## 2015-06-12 DIAGNOSIS — E785 Hyperlipidemia, unspecified: Secondary | ICD-10-CM

## 2015-06-12 NOTE — Telephone Encounter (Signed)
Labs entered per mychart message. 

## 2015-06-13 ENCOUNTER — Other Ambulatory Visit (INDEPENDENT_AMBULATORY_CARE_PROVIDER_SITE_OTHER): Payer: Commercial Managed Care - HMO

## 2015-06-13 DIAGNOSIS — E785 Hyperlipidemia, unspecified: Secondary | ICD-10-CM | POA: Diagnosis not present

## 2015-06-13 LAB — COMPREHENSIVE METABOLIC PANEL
ALK PHOS: 58 U/L (ref 39–117)
ALT: 19 U/L (ref 0–35)
AST: 20 U/L (ref 0–37)
Albumin: 3.9 g/dL (ref 3.5–5.2)
BUN: 11 mg/dL (ref 6–23)
CHLORIDE: 106 meq/L (ref 96–112)
CO2: 30 meq/L (ref 19–32)
Calcium: 9.5 mg/dL (ref 8.4–10.5)
Creatinine, Ser: 0.72 mg/dL (ref 0.40–1.20)
GFR: 87.67 mL/min (ref >60.00)
GLUCOSE: 88 mg/dL (ref 70–99)
POTASSIUM: 4.2 meq/L (ref 3.5–5.1)
SODIUM: 142 meq/L (ref 135–145)
TOTAL PROTEIN: 6.9 g/dL (ref 6.0–8.3)
Total Bilirubin: 0.3 mg/dL (ref 0.2–1.2)

## 2015-06-13 LAB — LIPID PANEL
CHOL/HDL RATIO: 4
Cholesterol: 167 mg/dL (ref 0–200)
HDL: 47.1 mg/dL (ref >39.00)
LDL Cholesterol: 97 mg/dL (ref 0–99)
NONHDL: 120.33
Triglycerides: 117 mg/dL (ref 0.0–149.0)
VLDL: 23.4 mg/dL (ref 0.0–40.0)

## 2015-06-15 ENCOUNTER — Encounter: Payer: Self-pay | Admitting: Family Medicine

## 2015-06-15 ENCOUNTER — Ambulatory Visit (INDEPENDENT_AMBULATORY_CARE_PROVIDER_SITE_OTHER): Payer: Commercial Managed Care - HMO | Admitting: Family Medicine

## 2015-06-15 VITALS — BP 138/82 | HR 74 | Temp 98.1°F | Ht 62.0 in | Wt 142.4 lb

## 2015-06-15 DIAGNOSIS — Z0000 Encounter for general adult medical examination without abnormal findings: Secondary | ICD-10-CM

## 2015-06-15 DIAGNOSIS — Z23 Encounter for immunization: Secondary | ICD-10-CM | POA: Diagnosis not present

## 2015-06-15 DIAGNOSIS — H65191 Other acute nonsuppurative otitis media, right ear: Secondary | ICD-10-CM

## 2015-06-15 DIAGNOSIS — B379 Candidiasis, unspecified: Secondary | ICD-10-CM | POA: Diagnosis not present

## 2015-06-15 DIAGNOSIS — K5900 Constipation, unspecified: Secondary | ICD-10-CM | POA: Diagnosis not present

## 2015-06-15 DIAGNOSIS — E785 Hyperlipidemia, unspecified: Secondary | ICD-10-CM | POA: Diagnosis not present

## 2015-06-15 DIAGNOSIS — R1013 Epigastric pain: Secondary | ICD-10-CM

## 2015-06-15 HISTORY — DX: Encounter for general adult medical examination without abnormal findings: Z00.00

## 2015-06-15 MED ORDER — ROSUVASTATIN CALCIUM 5 MG PO TABS: 5.0000 mg | ORAL_TABLET | Freq: Every day | ORAL | Status: DC

## 2015-06-15 MED ORDER — ISOMETHEPTENE-DICHLORAL-APAP 65-100-325 MG PO CAPS: 1.0000 | ORAL_CAPSULE | Freq: Four times a day (QID) | ORAL | Status: DC | PRN

## 2015-06-15 MED ORDER — FLUCONAZOLE 150 MG PO TABS: 150.0000 mg | ORAL_TABLET | Freq: Once | ORAL | Status: DC

## 2015-06-15 MED ORDER — CEFDINIR 300 MG PO CAPS: 300.0000 mg | ORAL_CAPSULE | Freq: Two times a day (BID) | ORAL | Status: AC

## 2015-06-15 NOTE — Progress Notes (Signed)
Pre visit review using our clinic review tool, if applicable. No additional management support is needed unless otherwise documented below in the visit note. 

## 2015-06-15 NOTE — Patient Instructions (Signed)
Encouraged increased hydration and fiber in diet. Daily probiotics. If bowels not moving can use MOM 2 tbls po in 4 oz of warm prune juice by mouth every 2-3 days. If no results then repeat in 4 hours with  Dulcolax suppository pr, may repeat again in 4 more hours as needed. Seek care if symptoms worsen. Consider daily Miralax and/or Dulcolax if symptoms persist.   Encouraged increased rest and hydration, add probiotics, zinc such as Coldeze or Xicam, cap. Treat fevers as needed. Mucinex twice daily x 10 days, Afrin at bed for a couple days as needed. Elderberry, Vitamin C 1000 mg daily, Aged or Black Garlic caps. [email or web address], [place] for Adults, Female A healthy lifestyle and preventive care can promote health and wellness. Preventive health guidelines for women include the following key practices.  A routine yearly physical is a good way to check with your health care provider about your health and preventive screening. It is a chance to share any concerns and updates on your health and to receive a thorough exam.  Visit your dentist for a routine exam and preventive care every 6 months. Brush your teeth twice a day and floss once a day. Good oral hygiene prevents tooth decay and gum disease.  The frequency of eye exams is based on your age, health, family medical history, use of contact lenses, and other factors. Follow your health care provider's recommendations for frequency of eye exams.  Eat a healthy diet. Foods like vegetables, fruits, whole grains, low-fat dairy products, and lean protein foods contain the nutrients you need without too many calories. Decrease your intake of foods high in solid fats, added sugars, and salt. Eat the right amount of calories for you.Get information about a proper diet from your health care provider, if necessary.  Regular physical exercise is one of the most important things you can do for your health. Most adults should get at  least 150 minutes of moderate-intensity exercise (any activity that increases your heart rate and causes you to sweat) each week. In addition, most adults need muscle-strengthening exercises on 2 or more days a week.  Maintain a healthy weight. The body mass index (BMI) is a screening tool to identify possible weight problems. It provides an estimate of body fat based on height and weight. Your health care provider can find your BMI and can help you achieve or maintain a healthy weight.For adults 20 years and older:  A BMI below 18.5 is considered underweight.  A BMI of 18.5 to 24.9 is normal.  A BMI of 25 to 29.9 is considered overweight.  A BMI of 30 and above is considered obese.  Maintain normal blood lipids and cholesterol levels by exercising and minimizing your intake of saturated fat. Eat a balanced diet with plenty of fruit and vegetables. Blood tests for lipids and cholesterol should begin at age 95 and be repeated every 5 years. If your lipid or cholesterol levels are high, you are over 50, or you are at high risk for heart disease, you may need your cholesterol levels checked more frequently.Ongoing high lipid and cholesterol levels should be treated with medicines if diet and exercise are not working.  If you smoke, find out from your health care provider how to quit. If you do not use tobacco, do not start.  Lung cancer screening is recommended for adults aged 56-80 years who are at high risk for developing lung cancer because of a history of smoking. A yearly  low-dose CT scan of the lungs is recommended for people who have at least a 30-pack-year history of smoking and are a current smoker or have quit within the past 15 years. A pack year of smoking is smoking an average of 1 pack of cigarettes a day for 1 year (for example: 1 pack a day for 30 years or 2 packs a day for 15 years). Yearly screening should continue until the smoker has stopped smoking for at least 15 years. Yearly  screening should be stopped for people who develop a health problem that would prevent them from having lung cancer treatment.  If you are pregnant, do not drink alcohol. If you are breastfeeding, be very cautious about drinking alcohol. If you are not pregnant and choose to drink alcohol, do not have more than 1 drink per day. One drink is considered to be 12 ounces (355 mL) of beer, 5 ounces (148 mL) of wine, or 1.5 ounces (44 mL) of liquor.  Avoid use of street drugs. Do not share needles with anyone. Ask for help if you need support or instructions about stopping the use of drugs.  High blood pressure causes heart disease and increases the risk of stroke. Your blood pressure should be checked at least every 1 to 2 years. Ongoing high blood pressure should be treated with medicines if weight loss and exercise do not work.  If you are 29-50 years old, ask your health care provider if you should take aspirin to prevent strokes.  Diabetes screening is done by taking a blood sample to check your blood glucose level after you have not eaten for a certain period of time (fasting). If you are not overweight and you do not have risk factors for diabetes, you should be screened once every 3 years starting at age 11. If you are overweight or obese and you are 39-21 years of age, you should be screened for diabetes every year as part of your cardiovascular risk assessment.  Breast cancer screening is essential preventive care for women. You should practice "breast self-awareness." This means understanding the normal appearance and feel of your breasts and may include breast self-examination. Any changes detected, no matter how small, should be reported to a health care provider. Women in their 51s and 30s should have a clinical breast exam (CBE) by a health care provider as part of a regular health exam every 1 to 3 years. After age 61, women should have a CBE every year. Starting at age 13, women should  consider having a mammogram (breast X-ray test) every year. Women who have a family history of breast cancer should talk to their health care provider about genetic screening. Women at a high risk of breast cancer should talk to their health care providers about having an MRI and a mammogram every year.  Breast cancer gene (BRCA)-related cancer risk assessment is recommended for women who have family members with BRCA-related cancers. BRCA-related cancers include breast, ovarian, tubal, and peritoneal cancers. Having family members with these cancers may be associated with an increased risk for harmful changes (mutations) in the breast cancer genes BRCA1 and BRCA2. Results of the assessment will determine the need for genetic counseling and BRCA1 and BRCA2 testing.  Your health care provider may recommend that you be screened regularly for cancer of the pelvic organs (ovaries, uterus, and vagina). This screening involves a pelvic examination, including checking for microscopic changes to the surface of your cervix (Pap test). You may be encouraged to  have this screening done every 3 years, beginning at age 46.  For women ages 103-65, health care providers may recommend pelvic exams and Pap testing every 3 years, or they may recommend the Pap and pelvic exam, combined with testing for human papilloma virus (HPV), every 5 years. Some types of HPV increase your risk of cervical cancer. Testing for HPV may also be done on women of any age with unclear Pap test results.  Other health care providers may not recommend any screening for nonpregnant women who are considered low risk for pelvic cancer and who do not have symptoms. Ask your health care provider if a screening pelvic exam is right for you.  If you have had past treatment for cervical cancer or a condition that could lead to cancer, you need Pap tests and screening for cancer for at least 20 years after your treatment. If Pap tests have been  discontinued, your risk factors (such as having a new sexual partner) need to be reassessed to determine if screening should resume. Some women have medical problems that increase the chance of getting cervical cancer. In these cases, your health care provider may recommend more frequent screening and Pap tests.  Colorectal cancer can be detected and often prevented. Most routine colorectal cancer screening begins at the age of 27 years and continues through age 86 years. However, your health care provider may recommend screening at an earlier age if you have risk factors for colon cancer. On a yearly basis, your health care provider may provide home test kits to check for hidden blood in the stool. Use of a small camera at the end of a tube, to directly examine the colon (sigmoidoscopy or colonoscopy), can detect the earliest forms of colorectal cancer. Talk to your health care provider about this at age 52, when routine screening begins. Direct exam of the colon should be repeated every 5-10 years through age 75 years, unless early forms of precancerous polyps or small growths are found.  People who are at an increased risk for hepatitis B should be screened for this virus. You are considered at high risk for hepatitis B if:  You were born in a country where hepatitis B occurs often. Talk with your health care provider about which countries are considered high risk.  Your parents were born in a high-risk country and you have not received a shot to protect against hepatitis B (hepatitis B vaccine).  You have HIV or AIDS.  You use needles to inject street drugs.  You live with, or have sex with, someone who has hepatitis B.  You get hemodialysis treatment.  You take certain medicines for conditions like cancer, organ transplantation, and autoimmune conditions.  Hepatitis C blood testing is recommended for all people born from [birth year] through 1965 and any individual with known risks for hepatitis  C.  Practice safe sex. Use condoms and avoid high-risk sexual practices to reduce the spread of sexually transmitted infections (STIs). STIs include gonorrhea, chlamydia, syphilis, trichomonas, herpes, HPV, and human immunodeficiency virus (HIV). Herpes, HIV, and HPV are viral illnesses that have no cure. They can result in disability, cancer, and death.  You should be screened for sexually transmitted illnesses (STIs) including gonorrhea and chlamydia if:  You are sexually active and are younger than 24 years.  You are older than 24 years and your health care provider tells you that you are at risk for this type of infection.  Your sexual activity has changed since you were  last screened and you are at an increased risk for chlamydia or gonorrhea. Ask your health care provider if you are at risk.  If you are at risk of being infected with HIV, it is recommended that you take a prescription medicine daily to prevent HIV infection. This is called preexposure prophylaxis (PrEP). You are considered at risk if:  You are sexually active and do not regularly use condoms or know the HIV status of your partner(s).  You take drugs by injection.  You are sexually active with a partner who has HIV.  Talk with your health care provider about whether you are at high risk of being infected with HIV. If you choose to begin PrEP, you should first be tested for HIV. You should then be tested every 3 months for as long as you are taking PrEP.  Osteoporosis is a disease in which the bones lose minerals and strength with aging. This can result in serious bone fractures or breaks. The risk of osteoporosis can be identified using a bone density scan. Women ages 57 years and over and women at risk for fractures or osteoporosis should discuss screening with their health care providers. Ask your health care provider whether you should take a calcium supplement or vitamin D to reduce the rate of  osteoporosis.  Menopause can be associated with physical symptoms and risks. Hormone replacement therapy is available to decrease symptoms and risks. You should talk to your health care provider about whether hormone replacement therapy is right for you.  Use sunscreen. Apply sunscreen liberally and repeatedly throughout the day. You should seek shade when your shadow is shorter than you. Protect yourself by wearing long sleeves, pants, a wide-brimmed hat, and sunglasses year round, whenever you are outdoors.  Once a month, do a whole body skin exam, using a mirror to look at the skin on your back. Tell your health care provider of new moles, moles that have irregular borders, moles that are larger than a pencil eraser, or moles that have changed in shape or color.  Stay current with required vaccines (immunizations).  Influenza vaccine. All adults should be immunized every year.  Tetanus, diphtheria, and acellular pertussis (Td, Tdap) vaccine. Pregnant women should receive 1 dose of Tdap vaccine during each pregnancy. The dose should be obtained regardless of the length of time since the last dose. Immunization is preferred during the 27th-36th week of gestation. An adult who has not previously received Tdap or who does not know her vaccine status should receive 1 dose of Tdap. This initial dose should be followed by tetanus and diphtheria toxoids (Td) booster doses every 10 years. Adults with an unknown or incomplete history of completing a 3-dose immunization series with Td-containing vaccines should begin or complete a primary immunization series including a Tdap dose. Adults should receive a Td booster every 10 years.  Varicella vaccine. An adult without evidence of immunity to varicella should receive 2 doses or a second dose if she has previously received 1 dose. Pregnant females who do not have evidence of immunity should receive the first dose after pregnancy. This first dose should be  obtained before leaving the health care facility. The second dose should be obtained 4-8 weeks after the first dose.  Human papillomavirus (HPV) vaccine. Females aged 13-26 years who have not received the vaccine previously should obtain the 3-dose series. The vaccine is not recommended for use in pregnant females. However, pregnancy testing is not needed before receiving a dose. If a  female is found to be pregnant after receiving a dose, no treatment is needed. In that case, the remaining doses should be delayed until after the pregnancy. Immunization is recommended for any person with an immunocompromised condition through the age of 39 years if she did not get any or all doses earlier. During the 3-dose series, the second dose should be obtained 4-8 weeks after the first dose. The third dose should be obtained 24 weeks after the first dose and 16 weeks after the second dose.  Zoster vaccine. One dose is recommended for adults aged 41 years or older unless certain conditions are present.  Measles, mumps, and rubella (MMR) vaccine. Adults born before [birth year] generally are considered immune to measles and mumps. Adults born in [birth year] or later should have 1 or more doses of MMR vaccine unless there is a contraindication to the vaccine or there is laboratory evidence of immunity to each of the three diseases. A routine second dose of MMR vaccine should be obtained at least 28 days after the first dose for students attending postsecondary schools, health care workers, or international travelers. People who received inactivated measles vaccine or an unknown type of measles vaccine during 1963-1967 should receive 2 doses of MMR vaccine. People who received inactivated mumps vaccine or an unknown type of mumps vaccine before 1979 and are at high risk for mumps infection should consider immunization with 2 doses of MMR vaccine. For females of childbearing age, rubella immunity should be determined. If there is no evidence  of immunity, females who are not pregnant should be vaccinated. If there is no evidence of immunity, females who are pregnant should delay immunization until after pregnancy. Unvaccinated health care workers born before [birth year] who lack laboratory evidence of measles, mumps, or rubella immunity or laboratory confirmation of disease should consider measles and mumps immunization with 2 doses of MMR vaccine or rubella immunization with 1 dose of MMR vaccine.  Pneumococcal 13-valent conjugate (PCV13) vaccine. When indicated, a person who is uncertain of his immunization history and has no record of immunization should receive the PCV13 vaccine. All adults 15 years of age and older should receive this vaccine. An adult aged 23 years or older who has certain medical conditions and has not been previously immunized should receive 1 dose of PCV13 vaccine. This PCV13 should be followed with a dose of pneumococcal polysaccharide (PPSV23) vaccine. Adults who are at high risk for pneumococcal disease should obtain the PPSV23 vaccine at least 8 weeks after the dose of PCV13 vaccine. Adults older than 60 years of age who have normal immune system function should obtain the PPSV23 vaccine dose at least 1 year after the dose of PCV13 vaccine.  Pneumococcal polysaccharide (PPSV23) vaccine. When PCV13 is also indicated, PCV13 should be obtained first. All adults aged 104 years and older should be immunized. An adult younger than age 84 years who has certain medical conditions should be immunized. Any person who resides in a nursing home or long-term care facility should be immunized. An adult smoker should be immunized. People with an immunocompromised condition and certain other conditions should receive both PCV13 and PPSV23 vaccines. People with human immunodeficiency virus (HIV) infection should be immunized as soon as possible after diagnosis. Immunization during chemotherapy or radiation therapy should be avoided. Routine use  of PPSV23 vaccine is not recommended for American Indians, [place] Natives, or people younger than 65 years unless there are medical conditions that require PPSV23 vaccine. When indicated, people who have unknown immunization  and have no record of immunization should receive PPSV23 vaccine. One-time revaccination 5 years after the first dose of PPSV23 is recommended for people aged 19-64 years who have chronic kidney failure, nephrotic syndrome, asplenia, or immunocompromised conditions. People who received 1-2 doses of PPSV23 before age 52 years should receive another dose of PPSV23 vaccine at age 30 years or later if at least 5 years have passed since the previous dose. Doses of PPSV23 are not needed for people immunized with PPSV23 at or after age 24 years.  Meningococcal vaccine. Adults with asplenia or persistent complement component deficiencies should receive 2 doses of quadrivalent meningococcal conjugate (MenACWY-D) vaccine. The doses should be obtained at least 2 months apart. Microbiologists working with certain meningococcal bacteria, [place] recruits, people at risk during an outbreak, and people who travel to or live in countries with a high rate of meningitis should be immunized. A first-year college student up through age 40 years who is living in a residence hall should receive a dose if she did not receive a dose on or after her 16th birthday. Adults who have certain high-risk conditions should receive one or more doses of vaccine.  Hepatitis A vaccine. Adults who wish to be protected from this disease, have certain high-risk conditions, work with hepatitis A-infected animals, work in hepatitis A research labs, or travel to or work in countries with a high rate of hepatitis A should be immunized. Adults who were previously unvaccinated and who anticipate close contact with an international adoptee during the first 60 days after arrival in the [place] from a country with a high rate of  hepatitis A should be immunized.  Hepatitis B vaccine. Adults who wish to be protected from this disease, have certain high-risk conditions, may be exposed to blood or other infectious body fluids, are household contacts or sex partners of hepatitis B positive people, are clients or workers in certain care facilities, or travel to or work in countries with a high rate of hepatitis B should be immunized.  Haemophilus influenzae type b (Hib) vaccine. A previously unvaccinated person with asplenia or sickle cell disease or having a scheduled splenectomy should receive 1 dose of Hib vaccine. Regardless of previous immunization, a recipient of a hematopoietic stem cell transplant should receive a 3-dose series 6-12 months after her successful transplant. Hib vaccine is not recommended for adults with HIV infection. Preventive Services / Frequency Ages 64 to 78 years  Blood pressure check.** / Every 3-5 years.  Lipid and cholesterol check.** / Every 5 years beginning at age 70.  Clinical breast exam.** / Every 3 years for women in their 99s and 44s.  BRCA-related cancer risk assessment.** / For women who have family members with a BRCA-related cancer (breast, ovarian, tubal, or peritoneal cancers).  Pap test.** / Every 2 years from ages 28 through 60. Every 3 years starting at age 32 through age 101 or 66 with a history of 3 consecutive normal Pap tests.  HPV screening.** / Every 3 years from ages 60 through ages 78 to 50 with a history of 3 consecutive normal Pap tests.  Hepatitis C blood test.** / For any individual with known risks for hepatitis C.  Skin self-exam. / Monthly.  Influenza vaccine. / Every year.  Tetanus, diphtheria, and acellular pertussis (Tdap, Td) vaccine.** / Consult your health care provider. Pregnant women should receive 1 dose of Tdap vaccine during each pregnancy. 1 dose of Td every 10 years.  Varicella vaccine.** / Consult your health  care provider. Pregnant females  who do not have evidence of immunity should receive the first dose after pregnancy.  HPV vaccine. / 3 doses over 6 months, if 84 and younger. The vaccine is not recommended for use in pregnant females. However, pregnancy testing is not needed before receiving a dose.  Measles, mumps, rubella (MMR) vaccine.** / You need at least 1 dose of MMR if you were born in 1957 or later. You may also need a 2nd dose. For females of childbearing age, rubella immunity should be determined. If there is no evidence of immunity, females who are not pregnant should be vaccinated. If there is no evidence of immunity, females who are pregnant should delay immunization until after pregnancy.  Pneumococcal 13-valent conjugate (PCV13) vaccine.** / Consult your health care provider.  Pneumococcal polysaccharide (PPSV23) vaccine.** / 1 to 2 doses if you smoke cigarettes or if you have certain conditions.  Meningococcal vaccine.** / 1 dose if you are age 29 to 59 years and a Market researcher living in a residence hall, or have one of several medical conditions, you need to get vaccinated against meningococcal disease. You may also need additional booster doses.  Hepatitis A vaccine.** / Consult your health care provider.  Hepatitis B vaccine.** / Consult your health care provider.  Haemophilus influenzae type b (Hib) vaccine.** / Consult your health care provider. Ages 56 to 74 years  Blood pressure check.** / Every year.  Lipid and cholesterol check.** / Every 5 years beginning at age 41 years.  Lung cancer screening. / Every year if you are aged 12-80 years and have a 30-pack-year history of smoking and currently smoke or have quit within the past 15 years. Yearly screening is stopped once you have quit smoking for at least 15 years or develop a health problem that would prevent you from having lung cancer treatment.  Clinical breast exam.** / Every year after age 49 years.  BRCA-related cancer risk  assessment.** / For women who have family members with a BRCA-related cancer (breast, ovarian, tubal, or peritoneal cancers).  Mammogram.** / Every year beginning at age 95 years and continuing for as long as you are in good health. Consult with your health care provider.  Pap test.** / Every 3 years starting at age 76 years through age 56 or 39 years with a history of 3 consecutive normal Pap tests.  HPV screening.** / Every 3 years from ages 16 years through ages 32 to 61 years with a history of 3 consecutive normal Pap tests.  Fecal occult blood test (FOBT) of stool. / Every year beginning at age 48 years and continuing until age 2 years. You may not need to do this test if you get a colonoscopy every 10 years.  Flexible sigmoidoscopy or colonoscopy.** / Every 5 years for a flexible sigmoidoscopy or every 10 years for a colonoscopy beginning at age 33 years and continuing until age 53 years.  Hepatitis C blood test.** / For all people born from [birth year] through 1966 and any individual with known risks for hepatitis C.  Skin self-exam. / Monthly.  Influenza vaccine. / Every year.  Tetanus, diphtheria, and acellular pertussis (Tdap/Td) vaccine.** / Consult your health care provider. Pregnant women should receive 1 dose of Tdap vaccine during each pregnancy. 1 dose of Td every 10 years.  Varicella vaccine.** / Consult your health care provider. Pregnant females who do not have evidence of immunity should receive the first dose after pregnancy.  Zoster vaccine.** / 1  dose for adults aged 49 years or older.  Measles, mumps, rubella (MMR) vaccine.** / You need at least 1 dose of MMR if you were born in 1957 or later. You may also need a second dose. For females of childbearing age, rubella immunity should be determined. If there is no evidence of immunity, females who are not pregnant should be vaccinated. If there is no evidence of immunity, females who are pregnant should delay immunization until  after pregnancy.  Pneumococcal 13-valent conjugate (PCV13) vaccine.** / Consult your health care provider.  Pneumococcal polysaccharide (PPSV23) vaccine.** / 1 to 2 doses if you smoke cigarettes or if you have certain conditions.  Meningococcal vaccine.** / Consult your health care provider.  Hepatitis A vaccine.** / Consult your health care provider.  Hepatitis B vaccine.** / Consult your health care provider.  Haemophilus influenzae type b (Hib) vaccine.** / Consult your health care provider. Ages 57 years and over  Blood pressure check.** / Every year.  Lipid and cholesterol check.** / Every 5 years beginning at age 64 years.  Lung cancer screening. / Every year if you are aged 71-80 years and have a 30-pack-year history of smoking and currently smoke or have quit within the past 15 years. Yearly screening is stopped once you have quit smoking for at least 15 years or develop a health problem that would prevent you from having lung cancer treatment.  Clinical breast exam.** / Every year after age 69 years.  BRCA-related cancer risk assessment.** / For women who have family members with a BRCA-related cancer (breast, ovarian, tubal, or peritoneal cancers).  Mammogram.** / Every year beginning at age 60 years and continuing for as long as you are in good health. Consult with your health care provider.  Pap test.** / Every 3 years starting at age 37 years through age 93 or 50 years with 3 consecutive normal Pap tests. Testing can be stopped between 65 and 70 years with 3 consecutive normal Pap tests and no abnormal Pap or HPV tests in the past 10 years.  HPV screening.** / Every 3 years from ages 3 years through ages 14 or 53 years with a history of 3 consecutive normal Pap tests. Testing can be stopped between 65 and 70 years with 3 consecutive normal Pap tests and no abnormal Pap or HPV tests in the past 10 years.  Fecal occult blood test (FOBT) of stool. / Every year beginning at  age 12 years and continuing until age 59 years. You may not need to do this test if you get a colonoscopy every 10 years.  Flexible sigmoidoscopy or colonoscopy.** / Every 5 years for a flexible sigmoidoscopy or every 10 years for a colonoscopy beginning at age 41 years and continuing until age 68 years.  Hepatitis C blood test.** / For all people born from [birth year] through 1966 and any individual with known risks for hepatitis C.  Osteoporosis screening.** / A one-time screening for women ages 55 years and over and women at risk for fractures or osteoporosis.  Skin self-exam. / Monthly.  Influenza vaccine. / Every year.  Tetanus, diphtheria, and acellular pertussis (Tdap/Td) vaccine.** / 1 dose of Td every 10 years.  Varicella vaccine.** / Consult your health care provider.  Zoster vaccine.** / 1 dose for adults aged 93 years or older.  Pneumococcal 13-valent conjugate (PCV13) vaccine.** / Consult your health care provider.  Pneumococcal polysaccharide (PPSV23) vaccine.** / 1 dose for all adults aged 60 years and older.  Meningococcal vaccine.** /  Consult your health care provider.  Hepatitis A vaccine.** / Consult your health care provider.  Hepatitis B vaccine.** / Consult your health care provider.  Haemophilus influenzae type b (Hib) vaccine.** / Consult your health care provider. ** Family history and personal history of risk and conditions may change your health care provider's recommendations.   This information is not intended to replace advice given to you by your health care provider. Make sure you discuss any questions you have with your health care provider.   Document Released: 08/26/2001 Document Revised: 07/21/2014 Document Reviewed: 11/25/2010 [employer] Interactive Patient Education [employer].

## 2015-06-15 NOTE — Patient Instructions (Addendum)
Encouraged increased hydration and fiber in diet. Daily probiotics. If bowels not moving can use MOM 2 tbls po in 4 oz of warm prune juice by mouth every 2-3 days. If no results then repeat in 4 hours with  Dulcolax suppository pr, may repeat again in 4 more hours as needed. Seek care if symptoms worsen. Consider daily Miralax and/or Dulcolax if symptoms persist.   Encouraged increased rest and hydration, add probiotics, zinc such as Coldeze or Xicam, cap. Treat fevers as needed. Mucinex twice daily x 10 days, Afrin at bed for a couple days as needed. Elderberry, Vitamin C 1000 mg daily, Aged or Black Garlic caps. [email or web address], [place] for Adults, Female A healthy lifestyle and preventive care can promote health and wellness. Preventive health guidelines for women include the following key practices.  A routine yearly physical is a good way to check with your health care provider about your health and preventive screening. It is a chance to share any concerns and updates on your health and to receive a thorough exam.  Visit your dentist for a routine exam and preventive care every 6 months. Brush your teeth twice a day and floss once a day. Good oral hygiene prevents tooth decay and gum disease.  The frequency of eye exams is based on your age, health, family medical history, use of contact lenses, and other factors. Follow your health care provider's recommendations for frequency of eye exams.  Eat a healthy diet. Foods like vegetables, fruits, whole grains, low-fat dairy products, and lean protein foods contain the nutrients you need without too many calories. Decrease your intake of foods high in solid fats, added sugars, and salt. Eat the right amount of calories for you.Get information about a proper diet from your health care provider, if necessary.  Regular physical exercise is one of the most important things you can do for your health. Most adults should get at  least 150 minutes of moderate-intensity exercise (any activity that increases your heart rate and causes you to sweat) each week. In addition, most adults need muscle-strengthening exercises on 2 or more days a week.  Maintain a healthy weight. The body mass index (BMI) is a screening tool to identify possible weight problems. It provides an estimate of body fat based on height and weight. Your health care provider can find your BMI and can help you achieve or maintain a healthy weight.For adults 20 years and older:  A BMI below 18.5 is considered underweight.  A BMI of 18.5 to 24.9 is normal.  A BMI of 25 to 29.9 is considered overweight.  A BMI of 30 and above is considered obese.  Maintain normal blood lipids and cholesterol levels by exercising and minimizing your intake of saturated fat. Eat a balanced diet with plenty of fruit and vegetables. Blood tests for lipids and cholesterol should begin at age 95 and be repeated every 5 years. If your lipid or cholesterol levels are high, you are over 50, or you are at high risk for heart disease, you may need your cholesterol levels checked more frequently.Ongoing high lipid and cholesterol levels should be treated with medicines if diet and exercise are not working.  If you smoke, find out from your health care provider how to quit. If you do not use tobacco, do not start.  Lung cancer screening is recommended for adults aged 56-80 years who are at high risk for developing lung cancer because of a history of smoking. A yearly  low-dose CT scan of the lungs is recommended for people who have at least a 30-pack-year history of smoking and are a current smoker or have quit within the past 15 years. A pack year of smoking is smoking an average of 1 pack of cigarettes a day for 1 year (for example: 1 pack a day for 30 years or 2 packs a day for 15 years). Yearly screening should continue until the smoker has stopped smoking for at least 15 years. Yearly  screening should be stopped for people who develop a health problem that would prevent them from having lung cancer treatment.  If you are pregnant, do not drink alcohol. If you are breastfeeding, be very cautious about drinking alcohol. If you are not pregnant and choose to drink alcohol, do not have more than 1 drink per day. One drink is considered to be 12 ounces (355 mL) of beer, 5 ounces (148 mL) of wine, or 1.5 ounces (44 mL) of liquor.  Avoid use of street drugs. Do not share needles with anyone. Ask for help if you need support or instructions about stopping the use of drugs.  High blood pressure causes heart disease and increases the risk of stroke. Your blood pressure should be checked at least every 1 to 2 years. Ongoing high blood pressure should be treated with medicines if weight loss and exercise do not work.  If you are 29-50 years old, ask your health care provider if you should take aspirin to prevent strokes.  Diabetes screening is done by taking a blood sample to check your blood glucose level after you have not eaten for a certain period of time (fasting). If you are not overweight and you do not have risk factors for diabetes, you should be screened once every 3 years starting at age 11. If you are overweight or obese and you are 39-21 years of age, you should be screened for diabetes every year as part of your cardiovascular risk assessment.  Breast cancer screening is essential preventive care for women. You should practice "breast self-awareness." This means understanding the normal appearance and feel of your breasts and may include breast self-examination. Any changes detected, no matter how small, should be reported to a health care provider. Women in their 51s and 30s should have a clinical breast exam (CBE) by a health care provider as part of a regular health exam every 1 to 3 years. After age 61, women should have a CBE every year. Starting at age 13, women should  consider having a mammogram (breast X-ray test) every year. Women who have a family history of breast cancer should talk to their health care provider about genetic screening. Women at a high risk of breast cancer should talk to their health care providers about having an MRI and a mammogram every year.  Breast cancer gene (BRCA)-related cancer risk assessment is recommended for women who have family members with BRCA-related cancers. BRCA-related cancers include breast, ovarian, tubal, and peritoneal cancers. Having family members with these cancers may be associated with an increased risk for harmful changes (mutations) in the breast cancer genes BRCA1 and BRCA2. Results of the assessment will determine the need for genetic counseling and BRCA1 and BRCA2 testing.  Your health care provider may recommend that you be screened regularly for cancer of the pelvic organs (ovaries, uterus, and vagina). This screening involves a pelvic examination, including checking for microscopic changes to the surface of your cervix (Pap test). You may be encouraged to  have this screening done every 3 years, beginning at age 46.  For women ages 103-65, health care providers may recommend pelvic exams and Pap testing every 3 years, or they may recommend the Pap and pelvic exam, combined with testing for human papilloma virus (HPV), every 5 years. Some types of HPV increase your risk of cervical cancer. Testing for HPV may also be done on women of any age with unclear Pap test results.  Other health care providers may not recommend any screening for nonpregnant women who are considered low risk for pelvic cancer and who do not have symptoms. Ask your health care provider if a screening pelvic exam is right for you.  If you have had past treatment for cervical cancer or a condition that could lead to cancer, you need Pap tests and screening for cancer for at least 20 years after your treatment. If Pap tests have been  discontinued, your risk factors (such as having a new sexual partner) need to be reassessed to determine if screening should resume. Some women have medical problems that increase the chance of getting cervical cancer. In these cases, your health care provider may recommend more frequent screening and Pap tests.  Colorectal cancer can be detected and often prevented. Most routine colorectal cancer screening begins at the age of 27 years and continues through age 86 years. However, your health care provider may recommend screening at an earlier age if you have risk factors for colon cancer. On a yearly basis, your health care provider may provide home test kits to check for hidden blood in the stool. Use of a small camera at the end of a tube, to directly examine the colon (sigmoidoscopy or colonoscopy), can detect the earliest forms of colorectal cancer. Talk to your health care provider about this at age 52, when routine screening begins. Direct exam of the colon should be repeated every 5-10 years through age 75 years, unless early forms of precancerous polyps or small growths are found.  People who are at an increased risk for hepatitis B should be screened for this virus. You are considered at high risk for hepatitis B if:  You were born in a country where hepatitis B occurs often. Talk with your health care provider about which countries are considered high risk.  Your parents were born in a high-risk country and you have not received a shot to protect against hepatitis B (hepatitis B vaccine).  You have HIV or AIDS.  You use needles to inject street drugs.  You live with, or have sex with, someone who has hepatitis B.  You get hemodialysis treatment.  You take certain medicines for conditions like cancer, organ transplantation, and autoimmune conditions.  Hepatitis C blood testing is recommended for all people born from [birth year] through 1965 and any individual with known risks for hepatitis  C.  Practice safe sex. Use condoms and avoid high-risk sexual practices to reduce the spread of sexually transmitted infections (STIs). STIs include gonorrhea, chlamydia, syphilis, trichomonas, herpes, HPV, and human immunodeficiency virus (HIV). Herpes, HIV, and HPV are viral illnesses that have no cure. They can result in disability, cancer, and death.  You should be screened for sexually transmitted illnesses (STIs) including gonorrhea and chlamydia if:  You are sexually active and are younger than 24 years.  You are older than 24 years and your health care provider tells you that you are at risk for this type of infection.  Your sexual activity has changed since you were  last screened and you are at an increased risk for chlamydia or gonorrhea. Ask your health care provider if you are at risk.  If you are at risk of being infected with HIV, it is recommended that you take a prescription medicine daily to prevent HIV infection. This is called preexposure prophylaxis (PrEP). You are considered at risk if:  You are sexually active and do not regularly use condoms or know the HIV status of your partner(s).  You take drugs by injection.  You are sexually active with a partner who has HIV.  Talk with your health care provider about whether you are at high risk of being infected with HIV. If you choose to begin PrEP, you should first be tested for HIV. You should then be tested every 3 months for as long as you are taking PrEP.  Osteoporosis is a disease in which the bones lose minerals and strength with aging. This can result in serious bone fractures or breaks. The risk of osteoporosis can be identified using a bone density scan. Women ages 57 years and over and women at risk for fractures or osteoporosis should discuss screening with their health care providers. Ask your health care provider whether you should take a calcium supplement or vitamin D to reduce the rate of  osteoporosis.  Menopause can be associated with physical symptoms and risks. Hormone replacement therapy is available to decrease symptoms and risks. You should talk to your health care provider about whether hormone replacement therapy is right for you.  Use sunscreen. Apply sunscreen liberally and repeatedly throughout the day. You should seek shade when your shadow is shorter than you. Protect yourself by wearing long sleeves, pants, a wide-brimmed hat, and sunglasses year round, whenever you are outdoors.  Once a month, do a whole body skin exam, using a mirror to look at the skin on your back. Tell your health care provider of new moles, moles that have irregular borders, moles that are larger than a pencil eraser, or moles that have changed in shape or color.  Stay current with required vaccines (immunizations).  Influenza vaccine. All adults should be immunized every year.  Tetanus, diphtheria, and acellular pertussis (Td, Tdap) vaccine. Pregnant women should receive 1 dose of Tdap vaccine during each pregnancy. The dose should be obtained regardless of the length of time since the last dose. Immunization is preferred during the 27th-36th week of gestation. An adult who has not previously received Tdap or who does not know her vaccine status should receive 1 dose of Tdap. This initial dose should be followed by tetanus and diphtheria toxoids (Td) booster doses every 10 years. Adults with an unknown or incomplete history of completing a 3-dose immunization series with Td-containing vaccines should begin or complete a primary immunization series including a Tdap dose. Adults should receive a Td booster every 10 years.  Varicella vaccine. An adult without evidence of immunity to varicella should receive 2 doses or a second dose if she has previously received 1 dose. Pregnant females who do not have evidence of immunity should receive the first dose after pregnancy. This first dose should be  obtained before leaving the health care facility. The second dose should be obtained 4-8 weeks after the first dose.  Human papillomavirus (HPV) vaccine. Females aged 13-26 years who have not received the vaccine previously should obtain the 3-dose series. The vaccine is not recommended for use in pregnant females. However, pregnancy testing is not needed before receiving a dose. If a  female is found to be pregnant after receiving a dose, no treatment is needed. In that case, the remaining doses should be delayed until after the pregnancy. Immunization is recommended for any person with an immunocompromised condition through the age of 39 years if she did not get any or all doses earlier. During the 3-dose series, the second dose should be obtained 4-8 weeks after the first dose. The third dose should be obtained 24 weeks after the first dose and 16 weeks after the second dose.  Zoster vaccine. One dose is recommended for adults aged 41 years or older unless certain conditions are present.  Measles, mumps, and rubella (MMR) vaccine. Adults born before [birth year] generally are considered immune to measles and mumps. Adults born in [birth year] or later should have 1 or more doses of MMR vaccine unless there is a contraindication to the vaccine or there is laboratory evidence of immunity to each of the three diseases. A routine second dose of MMR vaccine should be obtained at least 28 days after the first dose for students attending postsecondary schools, health care workers, or international travelers. People who received inactivated measles vaccine or an unknown type of measles vaccine during 1963-1967 should receive 2 doses of MMR vaccine. People who received inactivated mumps vaccine or an unknown type of mumps vaccine before 1979 and are at high risk for mumps infection should consider immunization with 2 doses of MMR vaccine. For females of childbearing age, rubella immunity should be determined. If there is no evidence  of immunity, females who are not pregnant should be vaccinated. If there is no evidence of immunity, females who are pregnant should delay immunization until after pregnancy. Unvaccinated health care workers born before [birth year] who lack laboratory evidence of measles, mumps, or rubella immunity or laboratory confirmation of disease should consider measles and mumps immunization with 2 doses of MMR vaccine or rubella immunization with 1 dose of MMR vaccine.  Pneumococcal 13-valent conjugate (PCV13) vaccine. When indicated, a person who is uncertain of his immunization history and has no record of immunization should receive the PCV13 vaccine. All adults 15 years of age and older should receive this vaccine. An adult aged 23 years or older who has certain medical conditions and has not been previously immunized should receive 1 dose of PCV13 vaccine. This PCV13 should be followed with a dose of pneumococcal polysaccharide (PPSV23) vaccine. Adults who are at high risk for pneumococcal disease should obtain the PPSV23 vaccine at least 8 weeks after the dose of PCV13 vaccine. Adults older than 60 years of age who have normal immune system function should obtain the PPSV23 vaccine dose at least 1 year after the dose of PCV13 vaccine.  Pneumococcal polysaccharide (PPSV23) vaccine. When PCV13 is also indicated, PCV13 should be obtained first. All adults aged 104 years and older should be immunized. An adult younger than age 84 years who has certain medical conditions should be immunized. Any person who resides in a nursing home or long-term care facility should be immunized. An adult smoker should be immunized. People with an immunocompromised condition and certain other conditions should receive both PCV13 and PPSV23 vaccines. People with human immunodeficiency virus (HIV) infection should be immunized as soon as possible after diagnosis. Immunization during chemotherapy or radiation therapy should be avoided. Routine use  of PPSV23 vaccine is not recommended for American Indians, [place] Natives, or people younger than 65 years unless there are medical conditions that require PPSV23 vaccine. When indicated, people who have unknown immunization  and have no record of immunization should receive PPSV23 vaccine. One-time revaccination 5 years after the first dose of PPSV23 is recommended for people aged 19-64 years who have chronic kidney failure, nephrotic syndrome, asplenia, or immunocompromised conditions. People who received 1-2 doses of PPSV23 before age 52 years should receive another dose of PPSV23 vaccine at age 30 years or later if at least 5 years have passed since the previous dose. Doses of PPSV23 are not needed for people immunized with PPSV23 at or after age 24 years.  Meningococcal vaccine. Adults with asplenia or persistent complement component deficiencies should receive 2 doses of quadrivalent meningococcal conjugate (MenACWY-D) vaccine. The doses should be obtained at least 2 months apart. Microbiologists working with certain meningococcal bacteria, [place] recruits, people at risk during an outbreak, and people who travel to or live in countries with a high rate of meningitis should be immunized. A first-year college student up through age 40 years who is living in a residence hall should receive a dose if she did not receive a dose on or after her 16th birthday. Adults who have certain high-risk conditions should receive one or more doses of vaccine.  Hepatitis A vaccine. Adults who wish to be protected from this disease, have certain high-risk conditions, work with hepatitis A-infected animals, work in hepatitis A research labs, or travel to or work in countries with a high rate of hepatitis A should be immunized. Adults who were previously unvaccinated and who anticipate close contact with an international adoptee during the first 60 days after arrival in the [place] from a country with a high rate of  hepatitis A should be immunized.  Hepatitis B vaccine. Adults who wish to be protected from this disease, have certain high-risk conditions, may be exposed to blood or other infectious body fluids, are household contacts or sex partners of hepatitis B positive people, are clients or workers in certain care facilities, or travel to or work in countries with a high rate of hepatitis B should be immunized.  Haemophilus influenzae type b (Hib) vaccine. A previously unvaccinated person with asplenia or sickle cell disease or having a scheduled splenectomy should receive 1 dose of Hib vaccine. Regardless of previous immunization, a recipient of a hematopoietic stem cell transplant should receive a 3-dose series 6-12 months after her successful transplant. Hib vaccine is not recommended for adults with HIV infection. Preventive Services / Frequency Ages 64 to 78 years  Blood pressure check.** / Every 3-5 years.  Lipid and cholesterol check.** / Every 5 years beginning at age 70.  Clinical breast exam.** / Every 3 years for women in their 99s and 44s.  BRCA-related cancer risk assessment.** / For women who have family members with a BRCA-related cancer (breast, ovarian, tubal, or peritoneal cancers).  Pap test.** / Every 2 years from ages 28 through 60. Every 3 years starting at age 32 through age 101 or 66 with a history of 3 consecutive normal Pap tests.  HPV screening.** / Every 3 years from ages 60 through ages 78 to 50 with a history of 3 consecutive normal Pap tests.  Hepatitis C blood test.** / For any individual with known risks for hepatitis C.  Skin self-exam. / Monthly.  Influenza vaccine. / Every year.  Tetanus, diphtheria, and acellular pertussis (Tdap, Td) vaccine.** / Consult your health care provider. Pregnant women should receive 1 dose of Tdap vaccine during each pregnancy. 1 dose of Td every 10 years.  Varicella vaccine.** / Consult your health  care provider. Pregnant females  who do not have evidence of immunity should receive the first dose after pregnancy.  HPV vaccine. / 3 doses over 6 months, if 84 and younger. The vaccine is not recommended for use in pregnant females. However, pregnancy testing is not needed before receiving a dose.  Measles, mumps, rubella (MMR) vaccine.** / You need at least 1 dose of MMR if you were born in 1957 or later. You may also need a 2nd dose. For females of childbearing age, rubella immunity should be determined. If there is no evidence of immunity, females who are not pregnant should be vaccinated. If there is no evidence of immunity, females who are pregnant should delay immunization until after pregnancy.  Pneumococcal 13-valent conjugate (PCV13) vaccine.** / Consult your health care provider.  Pneumococcal polysaccharide (PPSV23) vaccine.** / 1 to 2 doses if you smoke cigarettes or if you have certain conditions.  Meningococcal vaccine.** / 1 dose if you are age 29 to 59 years and a Market researcher living in a residence hall, or have one of several medical conditions, you need to get vaccinated against meningococcal disease. You may also need additional booster doses.  Hepatitis A vaccine.** / Consult your health care provider.  Hepatitis B vaccine.** / Consult your health care provider.  Haemophilus influenzae type b (Hib) vaccine.** / Consult your health care provider. Ages 56 to 74 years  Blood pressure check.** / Every year.  Lipid and cholesterol check.** / Every 5 years beginning at age 41 years.  Lung cancer screening. / Every year if you are aged 12-80 years and have a 30-pack-year history of smoking and currently smoke or have quit within the past 15 years. Yearly screening is stopped once you have quit smoking for at least 15 years or develop a health problem that would prevent you from having lung cancer treatment.  Clinical breast exam.** / Every year after age 49 years.  BRCA-related cancer risk  assessment.** / For women who have family members with a BRCA-related cancer (breast, ovarian, tubal, or peritoneal cancers).  Mammogram.** / Every year beginning at age 95 years and continuing for as long as you are in good health. Consult with your health care provider.  Pap test.** / Every 3 years starting at age 76 years through age 56 or 39 years with a history of 3 consecutive normal Pap tests.  HPV screening.** / Every 3 years from ages 16 years through ages 32 to 61 years with a history of 3 consecutive normal Pap tests.  Fecal occult blood test (FOBT) of stool. / Every year beginning at age 48 years and continuing until age 2 years. You may not need to do this test if you get a colonoscopy every 10 years.  Flexible sigmoidoscopy or colonoscopy.** / Every 5 years for a flexible sigmoidoscopy or every 10 years for a colonoscopy beginning at age 33 years and continuing until age 53 years.  Hepatitis C blood test.** / For all people born from [birth year] through 1965 and any individual with known risks for hepatitis C.  Skin self-exam. / Monthly.  Influenza vaccine. / Every year.  Tetanus, diphtheria, and acellular pertussis (Tdap/Td) vaccine.** / Consult your health care provider. Pregnant women should receive 1 dose of Tdap vaccine during each pregnancy. 1 dose of Td every 10 years.  Varicella vaccine.** / Consult your health care provider. Pregnant females who do not have evidence of immunity should receive the first dose after pregnancy.  Zoster vaccine.** / 1  dose for adults aged 49 years or older.  Measles, mumps, rubella (MMR) vaccine.** / You need at least 1 dose of MMR if you were born in 1957 or later. You may also need a second dose. For females of childbearing age, rubella immunity should be determined. If there is no evidence of immunity, females who are not pregnant should be vaccinated. If there is no evidence of immunity, females who are pregnant should delay immunization until  after pregnancy.  Pneumococcal 13-valent conjugate (PCV13) vaccine.** / Consult your health care provider.  Pneumococcal polysaccharide (PPSV23) vaccine.** / 1 to 2 doses if you smoke cigarettes or if you have certain conditions.  Meningococcal vaccine.** / Consult your health care provider.  Hepatitis A vaccine.** / Consult your health care provider.  Hepatitis B vaccine.** / Consult your health care provider.  Haemophilus influenzae type b (Hib) vaccine.** / Consult your health care provider. Ages 57 years and over  Blood pressure check.** / Every year.  Lipid and cholesterol check.** / Every 5 years beginning at age 64 years.  Lung cancer screening. / Every year if you are aged 71-80 years and have a 30-pack-year history of smoking and currently smoke or have quit within the past 15 years. Yearly screening is stopped once you have quit smoking for at least 15 years or develop a health problem that would prevent you from having lung cancer treatment.  Clinical breast exam.** / Every year after age 69 years.  BRCA-related cancer risk assessment.** / For women who have family members with a BRCA-related cancer (breast, ovarian, tubal, or peritoneal cancers).  Mammogram.** / Every year beginning at age 60 years and continuing for as long as you are in good health. Consult with your health care provider.  Pap test.** / Every 3 years starting at age 37 years through age 93 or 50 years with 3 consecutive normal Pap tests. Testing can be stopped between 65 and 70 years with 3 consecutive normal Pap tests and no abnormal Pap or HPV tests in the past 10 years.  HPV screening.** / Every 3 years from ages 3 years through ages 14 or 53 years with a history of 3 consecutive normal Pap tests. Testing can be stopped between 65 and 70 years with 3 consecutive normal Pap tests and no abnormal Pap or HPV tests in the past 10 years.  Fecal occult blood test (FOBT) of stool. / Every year beginning at  age 12 years and continuing until age 59 years. You may not need to do this test if you get a colonoscopy every 10 years.  Flexible sigmoidoscopy or colonoscopy.** / Every 5 years for a flexible sigmoidoscopy or every 10 years for a colonoscopy beginning at age 41 years and continuing until age 68 years.  Hepatitis C blood test.** / For all people born from [birth year] through 1965 and any individual with known risks for hepatitis C.  Osteoporosis screening.** / A one-time screening for women ages 55 years and over and women at risk for fractures or osteoporosis.  Skin self-exam. / Monthly.  Influenza vaccine. / Every year.  Tetanus, diphtheria, and acellular pertussis (Tdap/Td) vaccine.** / 1 dose of Td every 10 years.  Varicella vaccine.** / Consult your health care provider.  Zoster vaccine.** / 1 dose for adults aged 93 years or older.  Pneumococcal 13-valent conjugate (PCV13) vaccine.** / Consult your health care provider.  Pneumococcal polysaccharide (PPSV23) vaccine.** / 1 dose for all adults aged 60 years and older.  Meningococcal vaccine.** /  Consult your health care provider.  Hepatitis A vaccine.** / Consult your health care provider.  Hepatitis B vaccine.** / Consult your health care provider.  Haemophilus influenzae type b (Hib) vaccine.** / Consult your health care provider. ** Family history and personal history of risk and conditions may change your health care provider's recommendations.   This information is not intended to replace advice given to you by your health care provider. Make sure you discuss any questions you have with your health care provider.   Document Released: 08/26/2001 Document Revised: 07/21/2014 Document Reviewed: 11/25/2010 Elsevier Interactive Patient Education [employer].

## 2015-06-17 ENCOUNTER — Encounter: Payer: Self-pay | Admitting: Family Medicine

## 2015-06-17 DIAGNOSIS — H65191 Other acute nonsuppurative otitis media, right ear: Secondary | ICD-10-CM | POA: Insufficient documentation

## 2015-06-17 DIAGNOSIS — K5900 Constipation, unspecified: Secondary | ICD-10-CM

## 2015-06-17 HISTORY — DX: Constipation, unspecified: K59.00

## 2015-06-17 NOTE — Assessment & Plan Note (Signed)
Patient with long standing abdominal pain intermittently. Has now undergone workup with gastroenterology and urology. Imaging initially suggested enlarged ureter on right by urology work up was unremarkable. Upon further imaging it was decided the patient was constipated. She was given a course of MiraLAX and actually her pain is largely resolved. Discussed the need for ongoing adequate hydration and fiber. Regular exercise and probiotics. Encouraged increased hydration and fiber in diet. Daily probiotics. If bowels not moving can use MOM 2 tbls po in 4 oz of warm prune juice by mouth every 2-3 days. If no results then repeat in 4 hours with  Dulcolax suppository pr, may repeat again in 4 more hours as needed. Seek care if symptoms worsen. Consider daily Miralax and/or Dulcolax if symptoms persist.

## 2015-06-17 NOTE — Assessment & Plan Note (Signed)
Patient encouraged to maintain heart healthy diet, regular exercise, adequate sleep. Consider daily probiotics. Take medications as prescribed. Labs reviewed. Given and reviewed copy of ACP documents from [employer] and encouraged to complete and return. Given flu shot today

## 2015-06-17 NOTE — Assessment & Plan Note (Addendum)
Antibiotics given. Encouraged increased rest and hydration, add probiotics, zinc such as Coldeze or Xicam. Treat fevers as needed. Vitamin C and elderberry. Given Diflucan to use if yeast infection develops

## 2015-06-17 NOTE — Assessment & Plan Note (Signed)
Encouraged heart healthy diet, increase exercise, avoid trans fats, consider a krill oil cap daily. Tolerating Crestor 

## 2015-06-17 NOTE — Progress Notes (Signed)
Subjective:    Patient ID: Alicia Summers, female    DOB: 1956-03-01, 59 y.o.   MRN: [id number]  Chief Complaint  Patient presents with  . Annual Exam    HPI Patient is in today for patient is in today for annual exam but is also not feeling well. She has not been feeling well for the last couple of weeks. She was exposed to a granddaughter with respiratory syncytial virus developed some head congestion cough congestion sore throat malaise was actually improving and then several days ago got worse again. Has had fevers, chills, head and chest congestion. Cough is productive of green phlegm and she's now developed ear pain malaise and myalgias again. Has a persistent headache. Acknowledges some anorexia but denies nausea, vomiting or diarrhea. No chest pain but some shortness of breath with coughing is noted. Denies CP/palp/GI or GU c/o. Taking meds as prescribed  Past Medical History  Diagnosis Date  . Hyperlipidemia   . History of colon polyps   . Hydronephrosis, right   . Acute bacterial sinusitis     dx 01-22-2015  . Migraine   . Arthritis   . Right flank pain     intermittant  . Borderline glaucoma     bilatera eyes  . Wears glasses   . Preventative health care 06/15/2015  . Constipation 06/17/2015    Past Surgical History  Procedure Laterality Date  . Breast enhancement surgery Bilateral 2003  . Colonoscopy  03-04-2007  . Tubal ligation    . Abdominal hysterectomy   1997    w/ Bladder suspension  . Cystoscopy with retrograde pyelogram, ureteroscopy and stent placement Right 01/24/2015    Procedure: CYSTOSCOPY WITH RIGHT RETROGRADE PYELOGRAM, DIAGNOSTIC URETEROSCOPY ;  Surgeon: Cleon Gustin, MD;  Location: [facility];  Service: Urology;  Laterality: Right;  60 MIN [id number]    Family History  Problem Relation Age of Onset  . COPD Mother   . Prostate cancer Father   . Colon cancer Maternal Grandmother   . Colon cancer Paternal  Grandfather   . Alcohol abuse Sister   . Cirrhosis Sister     Social History   Social History  . Marital Status: Married    Spouse Name: N/A  . Number of Children: 2  . Years of Education: N/A   Occupational History  . Not on file.   Social History Main Topics  . Smoking status: Never Smoker   . Smokeless tobacco: Never Used  . Alcohol Use: Yes     Comment: socially   . Drug Use: No  . 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 Not on file   Social History Narrative    Outpatient Prescriptions Prior to Visit  Medication Sig Dispense Refill  . acetaminophen (TYLENOL) 500 MG tablet Take 1,000 mg by mouth every 6 (six) hours as needed.    Marland Kitchen estradiol (ESTRACE) 0.1 MG/GM vaginal cream Place 1 Applicatorful vaginally at bedtime.    . timolol (BETIMOL) 0.25 % ophthalmic solution Place 1-2 drops into both eyes 2 (two) times daily.     . hyoscyamine (LEVSIN/SL) 0.125 MG SL tablet Take 1-2 tablets by mouth every 4 hours as needed for abdominal pain 100 tablet 11  . ibuprofen (ADVIL,MOTRIN) 800 MG tablet TAKE 1 TABLET BY MOUTH 3 TIMES DAILY AFTER MEALS  0  .  isometheptene-acetaminophen-dichloralphenazone (MIDRIN) 65-325-100 MG capsule Take 1 capsule by mouth 4 (four) times daily as needed for migraine. Maximum 5 capsules in 12 hours for migraine headaches, 8 capsules in 24 hours for tension headaches.    . rosuvastatin (CRESTOR) 10 MG tablet Take 1 tablet (10 mg total) by mouth daily. (Patient taking differently: Take 10 mg by mouth every evening. ) 90 tablet 1   No facility-administered medications prior to visit.    Allergies  Allergen Reactions  . Dilaudid [Hydromorphone Hcl] Itching    Review of Systems  Constitutional: Positive for fever, chills and malaise/fatigue.  HENT: Positive for congestion, ear pain and sore throat. Negative for ear  discharge, hearing loss and tinnitus.   Eyes: Negative for discharge.  Respiratory: Positive for cough, sputum production and shortness of breath.   Cardiovascular: Negative for chest pain, palpitations and leg swelling.  Gastrointestinal: Negative for heartburn, nausea, vomiting, abdominal pain, diarrhea, constipation and blood in stool.  Genitourinary: Negative for dysuria, urgency, frequency and hematuria.  Musculoskeletal: Positive for myalgias. Negative for back pain and falls.  Skin: Negative for rash.  Neurological: Positive for headaches. Negative for dizziness, sensory change, loss of consciousness and weakness.  Endo/Heme/Allergies: Negative for environmental allergies. Does not bruise/bleed easily.  Psychiatric/Behavioral: Negative for depression and suicidal ideas. The patient is not nervous/anxious and does not have insomnia.        Objective:    Physical Exam  Constitutional: She is oriented to person, place, and time. She appears well-developed and well-nourished. No distress.  HENT:  Head: Normocephalic and atraumatic.  TMs erythematous b/l right>left. Nasal mucosa boggy and erythematous  Eyes: Conjunctivae are normal.  Neck: Neck supple. No thyromegaly present.  Cardiovascular: Normal rate, regular rhythm and normal heart sounds.   No murmur heard. Pulmonary/Chest: Effort normal and breath sounds normal. No respiratory distress.  Abdominal: Soft. Bowel sounds are normal. She exhibits no distension and no mass. There is no tenderness.  Musculoskeletal: She exhibits no edema.  Lymphadenopathy:    She has no cervical adenopathy.  Neurological: She is alert and oriented to person, place, and time.  Skin: Skin is warm and dry.  Psychiatric: She has a normal mood and affect. Her behavior is normal.    BP 138/82 mmHg  Pulse 74  Temp(Src) 98.1 F (36.7 C) (Oral)  Ht 5\' 2"  (1.575 m)  Wt 142 lb 6 oz (64.581 kg)  BMI 26.03 kg/m2  SpO2 97%  LMP 10/18/1995 Wt Readings  from Last 3 Encounters:  06/15/15 142 lb 6 oz (64.581 kg)  03/21/15 141 lb 6.4 oz (64.139 kg)  01/24/15 139 lb 8 oz (63.277 kg)     Lab Results  Component Value Date   WBC 4.2 12/18/2014   HGB 14.0 01/24/2015   HCT 38.8 12/18/2014   PLT 228.0 12/18/2014   GLUCOSE 88 06/13/2015   CHOL 167 06/13/2015   TRIG 117.0 06/13/2015   HDL 47.10 06/13/2015   LDLCALC 97 06/13/2015   ALT 19 06/13/2015   AST 20 06/13/2015   NA 142 06/13/2015   K 4.2 06/13/2015   CL 106 06/13/2015   CREATININE 0.72 06/13/2015   BUN 11 06/13/2015   CO2 30 06/13/2015   TSH 1.82 12/18/2014    Lab Results  Component Value Date   TSH 1.82 12/18/2014   Lab Results  Component Value Date   WBC 4.2 12/18/2014   HGB 14.0 01/24/2015   HCT 38.8 12/18/2014   MCV 90.6 12/18/2014   PLT 228.0 12/18/2014  Lab Results  Component Value Date   NA 142 06/13/2015   K 4.2 06/13/2015   CO2 30 06/13/2015   GLUCOSE 88 06/13/2015   BUN 11 06/13/2015   CREATININE 0.72 06/13/2015   BILITOT 0.3 06/13/2015   ALKPHOS 58 06/13/2015   AST 20 06/13/2015   ALT 19 06/13/2015   PROT 6.9 06/13/2015   ALBUMIN 3.9 06/13/2015   CALCIUM 9.5 06/13/2015   GFR 87.67 06/13/2015   Lab Results  Component Value Date   CHOL 167 06/13/2015   Lab Results  Component Value Date   HDL 47.10 06/13/2015   Lab Results  Component Value Date   LDLCALC 97 06/13/2015   Lab Results  Component Value Date   TRIG 117.0 06/13/2015   Lab Results  Component Value Date   CHOLHDL 4 06/13/2015   No results found for: HGBA1C     Assessment & Plan:   Problem List Items Addressed This Visit    Acute nonsuppurative otitis media of right ear    Antibiotics given. Encouraged increased rest and hydration, add probiotics, zinc such as Coldeze or Xicam. Treat fevers as needed. Vitamin C and elderberry. Given Diflucan to use if yeast infection develops      Relevant Medications   cefdinir (OMNICEF) 300 MG capsule   fluconazole (DIFLUCAN)  150 MG tablet   Constipation    Patient with long standing abdominal pain intermittently. Has now undergone workup with gastroenterology and urology. Imaging initially suggested enlarged ureter on right by urology work up was unremarkable. Upon further imaging it was decided the patient was constipated. She was given a course of MiraLAX and actually her pain is largely resolved. Discussed the need for ongoing adequate hydration and fiber. Regular exercise and probiotics. Encouraged increased hydration and fiber in diet. Daily probiotics. If bowels not moving can use MOM 2 tbls po in 4 oz of warm prune juice by mouth every 2-3 days. If no results then repeat in 4 hours with  Dulcolax suppository pr, may repeat again in 4 more hours as needed. Seek care if symptoms worsen. Consider daily Miralax and/or Dulcolax if symptoms persist.       Dyspepsia    Avoid offending foods, start probiotics. Do not eat large meals in late evening and consider raising head of bed.       Hyperlipidemia    Encouraged heart healthy diet, increase exercise, avoid trans fats, consider a krill oil cap daily. Tolerating Crestor      Relevant Medications   rosuvastatin (CRESTOR) 5 MG tablet   Other Relevant Orders   TSH   CBC   Lipid panel   Comprehensive metabolic panel   Preventative health care - Primary    Patient encouraged to maintain heart healthy diet, regular exercise, adequate sleep. Consider daily probiotics. Take medications as prescribed. Labs reviewed. Given and reviewed copy of ACP documents from [employer] and encouraged to complete and return. Given flu shot today      Relevant Orders   TSH   CBC   Lipid panel   Comprehensive metabolic panel    Other Visit Diagnoses    Encounter for immunization        Candida infection        Relevant Medications    cefdinir (OMNICEF) 300 MG capsule    fluconazole (DIFLUCAN) 150 MG tablet       I have discontinued Ms. Birky's rosuvastatin,  ibuprofen, and hyoscyamine. I am also having her start on rosuvastatin,  cefdinir, and fluconazole. Additionally, I am having her maintain her timolol, estradiol, acetaminophen, and isometheptene-acetaminophen-dichloralphenazone.  Meds ordered this encounter  Medications  . DISCONTD: isometheptene-acetaminophen-dichloralphenazone (MIDRIN) 65-100-325 MG capsule    Sig: Take 1 capsule by mouth 4 (four) times daily as needed for migraine. Maximum 5 capsules in 12 hours for migraine headaches, 8 capsules in 24 hours for tension headaches.  . isometheptene-acetaminophen-dichloralphenazone (MIDRIN) 65-100-325 MG capsule    Sig: Take 1 capsule by mouth 4 (four) times daily as needed for migraine. Maximum 5 capsules in 12 hours for migraine headaches, 8 capsules in 24 hours for tension headaches.    Dispense:  30 capsule    Refill:  5  . rosuvastatin (CRESTOR) 5 MG tablet    Sig: Take 1 tablet (5 mg total) by mouth daily.    Dispense:  90 tablet    Refill:  1  . cefdinir (OMNICEF) 300 MG capsule    Sig: Take 1 capsule (300 mg total) by mouth 2 (two) times daily.    Dispense:  20 capsule    Refill:  0  . fluconazole (DIFLUCAN) 150 MG tablet    Sig: Take 1 tablet (150 mg total) by mouth once.    Dispense:  1 tablet    Refill:  1     Isidro Monks, MD

## 2015-06-17 NOTE — Assessment & Plan Note (Signed)
Avoid offending foods, start probiotics. Do not eat large meals in late evening and consider raising head of bed.  

## 2015-06-21 ENCOUNTER — Encounter: Payer: Self-pay | Admitting: Family Medicine

## 2015-06-21 ENCOUNTER — Other Ambulatory Visit: Payer: Self-pay | Admitting: Family Medicine

## 2015-06-21 DIAGNOSIS — B379 Candidiasis, unspecified: Secondary | ICD-10-CM

## 2015-06-21 MED ORDER — FLUCONAZOLE 150 MG PO TABS: 150.0000 mg | ORAL_TABLET | Freq: Once | ORAL | Status: DC

## 2015-06-27 ENCOUNTER — Ambulatory Visit (INDEPENDENT_AMBULATORY_CARE_PROVIDER_SITE_OTHER): Payer: Commercial Managed Care - HMO | Admitting: Medical

## 2015-06-27 ENCOUNTER — Encounter: Payer: Self-pay | Admitting: Medical

## 2015-06-27 VITALS — BP 140/88 | HR 77 | Temp 98.1°F | Ht 62.0 in | Wt 141.8 lb

## 2015-06-27 DIAGNOSIS — J029 Acute pharyngitis, unspecified: Secondary | ICD-10-CM

## 2015-06-27 DIAGNOSIS — J309 Allergic rhinitis, unspecified: Secondary | ICD-10-CM

## 2015-06-27 DIAGNOSIS — H6980 Other specified disorders of Eustachian tube, unspecified ear: Secondary | ICD-10-CM

## 2015-06-27 LAB — POCT RAPID STREP A (OFFICE): Rapid Strep A Screen: NEGATIVE

## 2015-06-27 MED ORDER — AZITHROMYCIN 250 MG PO TABS: ORAL_TABLET | ORAL | Status: DC

## 2015-06-27 MED ORDER — METHYLPREDNISOLONE ACETATE 40 MG/ML IJ SUSP
20.0000 mg | Freq: Once | INTRAMUSCULAR | Status: AC
  Administered 2015-06-27: 20 mg via INTRAMUSCULAR

## 2015-06-27 MED ORDER — FLUTICASONE PROPIONATE 50 MCG/ACT NA SUSP: 2.0000 | Freq: Every day | NASAL | Status: DC

## 2015-06-27 NOTE — Progress Notes (Signed)
Pre visit review using our clinic review tool, if applicable. No additional management support is needed unless otherwise documented below in the visit note. 

## 2015-06-27 NOTE — Progress Notes (Signed)
Subjective:    Patient ID: Alicia Summers, female    DOB: March 14, 1956, 59 y.o.   MRN: [id number]  HPI  Pt given cefdnir by Dr. Charlett Blake. At time rt ear OM on 06-17-2015. Feels better but some ear  mild pressure on and off. She also states prior to OM she had daily st, and cough.(Some sneezing preceded OM dx on 06-17-2015. Nasal congestin since Mid November.)  Pt has history of allergic rhinitis.    Pt on monday st, mild queezy stomach, pnd, rt ear pressure and some aint  ha. Last night she felt pretty good.  Pt states she felt good on Saturday. She started to feel above symptoms on Monday.  On last visit here she took cefdnir.  Pt states no longer coughing.    Review of Systems  HENT: Positive for congestion, postnasal drip and sore throat. Negative for ear pain, facial swelling, rhinorrhea and sinus pressure.   Respiratory: Negative for cough, chest tightness, shortness of breath and wheezing.   Gastrointestinal: Negative for vomiting, abdominal pain, diarrhea and constipation.       Mild intermittent queezy sensation  Musculoskeletal: Negative for myalgias and joint swelling.  Skin: Negative for rash.  Neurological: Positive for headaches. Negative for dizziness, tremors, facial asymmetry, speech difficulty, light-headedness and numbness.       Faint ha.  Psychiatric/Behavioral: Negative for behavioral problems and confusion.    Past Medical History  Diagnosis Date  . Hyperlipidemia   . History of colon polyps   . Hydronephrosis, right   . Acute bacterial sinusitis     dx 01-22-2015  . Migraine   . Arthritis   . Right flank pain     intermittant  . Borderline glaucoma     bilatera eyes  . Wears glasses   . Preventative health care 06/15/2015  . Constipation 06/17/2015    Social History   Social History  . Marital Status: Married    Spouse Name: N/A  . Number of Children: 2  . Years of Education: N/A   Occupational History  . Not on file.   Social History  Main Topics  . Smoking status: Never Smoker   . Smokeless tobacco: Never Used  . Alcohol Use: Yes     Comment: socially   . Drug Use: No  . 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 Not on file   Social History Narrative    Past Surgical History  Procedure Laterality Date  . Breast enhancement surgery Bilateral 2003  . Colonoscopy  03-04-2007  . Tubal ligation    . Abdominal hysterectomy   1997    w/ Bladder suspension  . Cystoscopy with retrograde pyelogram, ureteroscopy and stent placement Right 01/24/2015    Procedure: CYSTOSCOPY WITH RIGHT RETROGRADE PYELOGRAM, DIAGNOSTIC URETEROSCOPY ;  Surgeon: Cleon Gustin, MD;  Location: [facility];  Service: Urology;  Laterality: Right;  68 MIN [id number]    Family History  Problem Relation Age of Onset  . COPD Mother   . Prostate cancer Father   . Colon cancer Maternal Grandmother   . Colon cancer Paternal Grandfather   . Alcohol abuse Sister   . Cirrhosis Sister     Allergies  Allergen Reactions  . Dilaudid [Hydromorphone Hcl] Itching    Current Outpatient Prescriptions on File Prior to Visit  Medication Sig Dispense  Refill  . acetaminophen (TYLENOL) 500 MG tablet Take 1,000 mg by mouth every 6 (six) hours as needed.    Marland Kitchen estradiol (ESTRACE) 0.1 MG/GM vaginal cream Place 1 Applicatorful vaginally at bedtime.    . fluconazole (DIFLUCAN) 150 MG tablet Take 1 tablet (150 mg total) by mouth once. 1 tablet 1  . isometheptene-acetaminophen-dichloralphenazone (MIDRIN) 65-100-325 MG capsule Take 1 capsule by mouth 4 (four) times daily as needed for migraine. Maximum 5 capsules in 12 hours for migraine headaches, 8 capsules in 24 hours for tension headaches. 30 capsule 5  . rosuvastatin (CRESTOR) 5 MG tablet Take 1 tablet (5 mg total) by mouth daily. 90 tablet 1  . timolol  (BETIMOL) 0.25 % ophthalmic solution Place 1-2 drops into both eyes 2 (two) times daily.      No current facility-administered medications on file prior to visit.    BP 140/88 mmHg  Pulse 77  Temp(Src) 98.1 F (36.7 C) (Oral)  Ht 5\' 2"  (1.575 m)  Wt 141 lb 12.8 oz (64.32 kg)  BMI 25.93 kg/m2  SpO2 98%  LMP 10/18/1995       Objective:   Physical Exam  General  Mental Status - Alert. General Appearance - Well groomed. Not in acute distress.  Skin Rashes- No Rashes.  HEENT Head- Normal. Ear Auditory Canal - Left- Normal. Right - Normal.Tympanic Membrane- Left- Normal. Right- Normal. Eye Sclera/Conjunctiva- Left- Normal. Right- Normal. Nose & Sinuses Nasal Mucosa- Left-  Boggy and Congested. Right-  Boggy and  Congested.Bilateral no  maxillary and no frontal sinus pressure.(pt did have crease/allergic salute type on nose) Mouth & Throat Lips: Upper Lip- Normal: no dryness, cracking, pallor, cyanosis, or vesicular eruption. Lower Lip-Normal: no dryness, cracking, pallor, cyanosis or vesicular eruption. Buccal Mucosa- Bilateral- No Aphthous ulcers. Oropharynx- No Discharge or Erythema. +pnd. Tonsils: Characteristics- Bilateral- No Erythema or Congestion. Size/Enlargement- Bilateral- No enlargement. Discharge- bilateral-None.  Neck Neck- Supple. No Masses.   Chest and Lung Exam Auscultation: Breath Sounds:-Clear even and unlabored.  Cardiovascular Auscultation:Rythm- Regular, rate and rhythm. Murmurs & Other Heart Sounds:Ausculatation of the heart reveal- No Murmurs.  Lymphatic Head & Neck General Head & Neck Lymphatics: Bilateral: Description- No Localized lymphadenopathy.      Assessment & Plan:  I think you have some chronic allergic rhinitis with symptoms that you describe since November.  Your tympanic membranes don't appear infected now. So I think you have eustachian tube dysfunction. Flonase nasal spray and depo medrol IM should help this as well as  allergies.  Currently no strep throat by rapid ctest and you don't appear to need any immediate antibiotic.  In event you have worsening symptoms as described you start azithromycin but currently not indicated. I am making this available if you worsenover the weekend.  Your queezy sensation may be post antibiotic use effect. Please update [place] on this if persists or if symptoms change. You could start probiotic and use for one month.  Follow up in 10 days or as needed

## 2015-06-27 NOTE — Patient Instructions (Addendum)
I think you have some chronic allergic rhinitis with symptoms that you describe since November.  Your tympanic membranes don't appear infected now. So I think you have eustachian tube dysfunction. Flonase nasal spray and depo medrol IM should help this as well as allergies.  Currently no strep throat by rapid ctest and you don't appear to need any immediate antibiotic.  In event you have worsening symptoms as described you can start azithromycin but currently not indicated. I am making this available if you worsen over the weekend.  Your queezy sensation may be post antibiotic use effect. Please update [place] on this if persists or if symptoms change. You could start probiotic and use for one month.  Follow up in 10 days or as needed  Regarding your bp slight elevated when I check, I would ask you check you bp at home. If bp are 140/90 or above let us know. In that event might consider med. But today I think white coat type effect since bp was less with MA.

## 2016-06-13 ENCOUNTER — Other Ambulatory Visit (INDEPENDENT_AMBULATORY_CARE_PROVIDER_SITE_OTHER): Payer: Commercial Managed Care - HMO

## 2016-06-13 DIAGNOSIS — Z0000 Encounter for general adult medical examination without abnormal findings: Secondary | ICD-10-CM

## 2016-06-13 DIAGNOSIS — E785 Hyperlipidemia, unspecified: Secondary | ICD-10-CM | POA: Diagnosis not present

## 2016-06-13 LAB — LIPID PANEL
CHOLESTEROL: 168 mg/dL (ref 0–200)
HDL: 56.9 mg/dL (ref >39.00)
LDL CALC: 96 mg/dL (ref 0–99)
NONHDL: 110.98
Total CHOL/HDL Ratio: 3
Triglycerides: 77 mg/dL (ref 0.0–149.0)
VLDL: 15.4 mg/dL (ref 0.0–40.0)

## 2016-06-13 LAB — COMPREHENSIVE METABOLIC PANEL
ALBUMIN: 4.3 g/dL (ref 3.5–5.2)
ALT: 15 U/L (ref 0–35)
AST: 18 U/L (ref 0–37)
Alkaline Phosphatase: 56 U/L (ref 39–117)
BUN: 16 mg/dL (ref 6–23)
CHLORIDE: 106 meq/L (ref 96–112)
CO2: 29 mEq/L (ref 19–32)
CREATININE: 0.75 mg/dL (ref 0.40–1.20)
Calcium: 9.5 mg/dL (ref 8.4–10.5)
GFR: 83.36 mL/min (ref >60.00)
GLUCOSE: 90 mg/dL (ref 70–99)
POTASSIUM: 4.3 meq/L (ref 3.5–5.1)
SODIUM: 141 meq/L (ref 135–145)
TOTAL PROTEIN: 7.1 g/dL (ref 6.0–8.3)
Total Bilirubin: 0.4 mg/dL (ref 0.2–1.2)

## 2016-06-13 LAB — CBC
HEMATOCRIT: 38.7 % (ref 36.0–46.0)
HEMOGLOBIN: 13.2 g/dL (ref 12.0–15.0)
MCHC: 34.1 g/dL (ref 30.0–36.0)
MCV: 91.5 fl (ref 78.0–100.0)
Platelets: 247 10*3/uL (ref 150.0–400.0)
RBC: 4.23 Mil/uL (ref 3.87–5.11)
RDW: 12.4 % (ref 11.5–15.5)
WBC: 4.3 10*3/uL (ref 4.0–10.5)

## 2016-06-13 LAB — TSH: TSH: 1.88 u[IU]/mL (ref 0.35–4.50)

## 2016-06-16 ENCOUNTER — Encounter: Payer: Commercial Managed Care - HMO | Admitting: Family Medicine

## 2016-06-19 ENCOUNTER — Ambulatory Visit (INDEPENDENT_AMBULATORY_CARE_PROVIDER_SITE_OTHER): Payer: Commercial Managed Care - HMO | Admitting: Family Medicine

## 2016-06-19 ENCOUNTER — Encounter: Payer: Self-pay | Admitting: Family Medicine

## 2016-06-19 DIAGNOSIS — K5900 Constipation, unspecified: Secondary | ICD-10-CM | POA: Diagnosis not present

## 2016-06-19 DIAGNOSIS — Z0000 Encounter for general adult medical examination without abnormal findings: Secondary | ICD-10-CM

## 2016-06-19 DIAGNOSIS — E785 Hyperlipidemia, unspecified: Secondary | ICD-10-CM | POA: Diagnosis not present

## 2016-06-19 MED ORDER — ROSUVASTATIN CALCIUM 5 MG PO TABS: 5.0000 mg | ORAL_TABLET | Freq: Every day | ORAL | 3 refills | Status: DC

## 2016-06-19 NOTE — Assessment & Plan Note (Signed)
Patient encouraged to maintain heart healthy diet, regular exercise, adequate sleep. Consider daily probiotics. Take medications as prescribed. Given and reviewed copy of ACP documents from [place] Secretary of State and encouraged to complete and return 

## 2016-06-19 NOTE — Assessment & Plan Note (Signed)
Doing better no longer using miralax

## 2016-06-19 NOTE — Progress Notes (Signed)
Pre visit review using our clinic review tool, if applicable. No additional management support is needed unless otherwise documented below in the visit note. 

## 2016-06-19 NOTE — Patient Instructions (Addendum)
Lidocaine gel, patches, roll on [employer] [place]'s leg cramps med Magnesium/calcium for cramps  Preventive Care 40-64 Years, Female Preventive care refers to lifestyle choices and visits with your health care provider that can promote health and wellness. What does preventive care include?  A yearly physical exam. This is also called an annual well check.  Dental exams once or twice a year.  Routine eye exams. Ask your health care provider how often you should have your eyes checked.  Personal lifestyle choices, including:  Daily care of your teeth and gums.  Regular physical activity.  Eating a healthy diet.  Avoiding tobacco and drug use.  Limiting alcohol use.  Practicing safe sex.  Taking low-dose aspirin daily starting at age 51.  Taking vitamin and mineral supplements as recommended by your health care provider. What happens during an annual well check? The services and screenings done by your health care provider during your annual well check will depend on your age, overall health, lifestyle risk factors, and family history of disease. Counseling  Your health care provider may ask you questions about your:  Alcohol use.  Tobacco use.  Drug use.  Emotional well-being.  Home and relationship well-being.  Sexual activity.  Eating habits.  Work and work Statistician.  Method of birth control.  Menstrual cycle.  Pregnancy history. Screening  You may have the following tests or measurements:  Height, weight, and BMI.  Blood pressure.  Lipid and cholesterol levels. These may be checked every 5 years, or more frequently if you are over 69 years old.  Skin check.  Lung cancer screening. You may have this screening every year starting at age 64 if you have a 30-pack-year history of smoking and currently smoke or have quit within the past 15 years.  Fecal occult blood test (FOBT) of the stool. You may have this test every year starting at age  73.  Flexible sigmoidoscopy or colonoscopy. You may have a sigmoidoscopy every 5 years or a colonoscopy every 10 years starting at age 14.  Hepatitis C blood test.  Hepatitis B blood test.  Sexually transmitted disease (STD) testing.  Diabetes screening. This is done by checking your blood sugar (glucose) after you have not eaten for a while (fasting). You may have this done every 1-3 years.  Mammogram. This may be done every 1-2 years. Talk to your health care provider about when you should start having regular mammograms. This may depend on whether you have a family history of breast cancer.  BRCA-related cancer screening. This may be done if you have a family history of breast, ovarian, tubal, or peritoneal cancers.  Pelvic exam and Pap test. This may be done every 3 years starting at age 15. Starting at age 63, this may be done every 5 years if you have a Pap test in combination with an HPV test.  Bone density scan. This is done to screen for osteoporosis. You may have this scan if you are at high risk for osteoporosis. Discuss your test results, treatment options, and if necessary, the need for more tests with your health care provider. Vaccines  Your health care provider may recommend certain vaccines, such as:  Influenza vaccine. This is recommended every year.  Tetanus, diphtheria, and acellular pertussis (Tdap, Td) vaccine. You may need a Td booster every 10 years.  Varicella vaccine. You may need this if you have not been vaccinated.  Zoster vaccine. You may need this after age 60.  Measles, mumps, and  rubella (MMR) vaccine. You may need at least one dose of MMR if you were born in 1957 or later. You may also need a second dose.  Pneumococcal 13-valent conjugate (PCV13) vaccine. You may need this if you have certain conditions and were not previously vaccinated.  Pneumococcal polysaccharide (PPSV23) vaccine. You may need one or two doses if you smoke cigarettes or if you  have certain conditions.  Meningococcal vaccine. You may need this if you have certain conditions.  Hepatitis A vaccine. You may need this if you have certain conditions or if you travel or work in places where you may be exposed to hepatitis A.  Hepatitis B vaccine. You may need this if you have certain conditions or if you travel or work in places where you may be exposed to hepatitis B.  Haemophilus influenzae type b (Hib) vaccine. You may need this if you have certain conditions. Talk to your health care provider about which screenings and vaccines you need and how often you need them. This information is not intended to replace advice given to you by your health care provider. Make sure you discuss any questions you have with your health care provider. Document Released: 07/27/2015 Document Revised: 03/19/2016 Document Reviewed: 05/01/2015 Elsevier Interactive Patient Education  2017 [employer].

## 2016-06-19 NOTE — Assessment & Plan Note (Signed)
Encouraged heart healthy diet, increase exercise, avoid trans fats, consider a krill oil cap daily 

## 2016-07-01 ENCOUNTER — Telehealth: Payer: Commercial Managed Care - HMO | Admitting: Family

## 2016-07-01 DIAGNOSIS — J019 Acute sinusitis, unspecified: Secondary | ICD-10-CM

## 2016-07-01 DIAGNOSIS — B9689 Other specified bacterial agents as the cause of diseases classified elsewhere: Secondary | ICD-10-CM

## 2016-07-01 MED ORDER — AMOXICILLIN-POT CLAVULANATE 875-125 MG PO TABS: 1.0000 | ORAL_TABLET | Freq: Two times a day (BID) | ORAL | 0 refills | Status: DC

## 2016-07-01 NOTE — Progress Notes (Signed)

## 2016-07-13 NOTE — Progress Notes (Signed)
Patient ID: Alicia Summers, female   DOB: Jul 24, 1955, 60 y.o.   MRN: [id number]   Subjective:    Patient ID: Alicia Summers, female    DOB: 04-21-1956, 60 y.o.   MRN: [id number]  Chief Complaint  Patient presents with  . Annual Exam    HPI Patient is in today for annual preventative exam. She feels well today, no recent hospitalizations or acute concerns. She continues to struggle with low back pain and intermittent leg cramps. Doing well with ADLs at home. Trying to maintain a heart healthy diet. Denies CP/palp/SOB/HA/congestion/fevers/GI or GU c/o. Taking meds as prescribed  Past Medical History:  Diagnosis Date  . Acute bacterial sinusitis    dx 01-22-2015  . Arthritis   . Borderline glaucoma    bilatera eyes  . Constipation 06/17/2015  . History of colon polyps   . Hydronephrosis, right   . Hyperlipidemia   . Migraine   . Preventative health care 06/15/2015  . Right flank pain    intermittant  . Wears glasses     Past Surgical History:  Procedure Laterality Date  . ABDOMINAL HYSTERECTOMY   1997   w/ Bladder suspension  . BREAST ENHANCEMENT SURGERY Bilateral 2003  . COLONOSCOPY  03-04-2007  . CYSTOSCOPY WITH RETROGRADE PYELOGRAM, URETEROSCOPY AND STENT PLACEMENT Right 01/24/2015   Procedure: CYSTOSCOPY WITH RIGHT RETROGRADE PYELOGRAM, DIAGNOSTIC URETEROSCOPY ;  Surgeon: Cleon Gustin, MD;  Location: [facility];  Service: Urology;  Laterality: Right;  [place]er [id number]  . TUBAL LIGATION      Family History  Problem Relation Age of Onset  . COPD Mother   . Prostate cancer Father   . Colon cancer Maternal Grandmother   . Colon cancer Paternal Grandfather   . Alcohol abuse Sister   . Cirrhosis Sister     Social History   Social History  . Marital status: Married    Spouse name: N/A  . Number of children: 2  . Years of education: N/A   Occupational History  . Not on file.   Social History Main Topics  . Smoking status:  Never Smoker  . Smokeless tobacco: Never Used  . Alcohol use Yes     Comment: socially   . Drug use: No  . 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 avoid dairy   Other Topics Concern  . Not on file   Social History Narrative  . No narrative on file    Outpatient Medications Prior to Visit  Medication Sig Dispense Refill  . timolol (BETIMOL) 0.25 % ophthalmic solution Place 1-2 drops into both eyes 2 (two) times daily.     Marland Kitchen acetaminophen (TYLENOL) 500 MG tablet Take 1,000 mg by mouth every 6 (six) hours as needed.    Marland Kitchen azithromycin (ZITHROMAX) 250 MG tablet Take 2 tablets by mouth on day 1, followed by 1 tablet by mouth daily for 4 days. 6 tablet 0  . estradiol (ESTRACE) 0.1 MG/GM vaginal cream Place 1 Applicatorful vaginally at bedtime.    . fluconazole (DIFLUCAN) 150 MG tablet Take 1 tablet (150 mg total) by mouth once. 1 tablet 1  . fluticasone (FLONASE) 50 MCG/ACT nasal spray Place 2 sprays into both nostrils daily. (Patient not taking: Reported on 06/19/2016) 16 g 1  . isometheptene-acetaminophen-dichloralphenazone (MIDRIN) 65-100-325 MG capsule Take 1 capsule by mouth 4 (four) times daily as needed for migraine. Maximum 5 capsules  in 12 hours for migraine headaches, 8 capsules in 24 hours for tension headaches. 30 capsule 5  . rosuvastatin (CRESTOR) 5 MG tablet Take 1 tablet (5 mg total) by mouth daily. 90 tablet 1   No facility-administered medications prior to visit.     Allergies  Allergen Reactions  . Dilaudid [Hydromorphone Hcl] Itching    Review of Systems  Constitutional: Negative for chills, fever and malaise/fatigue.  HENT: Negative for congestion and hearing loss.   Eyes: Negative for discharge.  Respiratory: Negative for cough, sputum production and shortness of breath.   Cardiovascular: Negative for chest pain, palpitations and leg swelling.  Gastrointestinal: Negative  for abdominal pain, blood in stool, constipation, diarrhea, heartburn, nausea and vomiting.  Genitourinary: Negative for dysuria, frequency, hematuria and urgency.  Musculoskeletal: Positive for myalgias. Negative for back pain and falls.  Skin: Negative for rash.  Neurological: Negative for dizziness, sensory change, loss of consciousness, weakness and headaches.  Endo/Heme/Allergies: Negative for environmental allergies. Does not bruise/bleed easily.  Psychiatric/Behavioral: Negative for depression and suicidal ideas. The patient is not nervous/anxious and does not have insomnia.        Objective:    Physical Exam  Constitutional: She is oriented to person, place, and time. She appears well-developed and well-nourished. No distress.  HENT:  Head: Normocephalic and atraumatic.  Eyes: Conjunctivae are normal.  Neck: Neck supple. No thyromegaly present.  Cardiovascular: Normal rate, regular rhythm and normal heart sounds.   No murmur heard. Pulmonary/Chest: Effort normal and breath sounds normal. No respiratory distress.  Abdominal: Soft. Bowel sounds are normal. She exhibits no distension and no mass. There is no tenderness.  Musculoskeletal: She exhibits no edema.  Lymphadenopathy:    She has no cervical adenopathy.  Neurological: She is alert and oriented to person, place, and time.  Skin: Skin is warm and dry.  Psychiatric: She has a normal mood and affect. Her behavior is normal.    BP 128/80 (BP Location: Left Arm, Patient Position: Sitting, Cuff Size: Normal)   Pulse 66   Temp 97.8 F (36.6 C) (Oral)   Ht 5\' 2"  (1.575 m)   Wt 138 lb 6 oz (62.8 kg)   LMP 10/18/1995   SpO2 99%   BMI 25.31 kg/m  Wt Readings from Last 3 Encounters:  06/19/16 138 lb 6 oz (62.8 kg)  06/27/15 141 lb 12.8 oz (64.3 kg)  06/15/15 142 lb 6 oz (64.6 kg)     Lab Results  Component Value Date   WBC 4.3 06/13/2016   HGB 13.2 06/13/2016   HCT 38.7 06/13/2016   PLT 247.0 06/13/2016   GLUCOSE  90 06/13/2016   CHOL 168 06/13/2016   TRIG 77.0 06/13/2016   HDL 56.90 06/13/2016   LDLCALC 96 06/13/2016   ALT 15 06/13/2016   AST 18 06/13/2016   NA 141 06/13/2016   K 4.3 06/13/2016   CL 106 06/13/2016   CREATININE 0.75 06/13/2016   BUN 16 06/13/2016   CO2 29 06/13/2016   TSH 1.88 06/13/2016    Lab Results  Component Value Date   TSH 1.88 06/13/2016   Lab Results  Component Value Date   WBC 4.3 06/13/2016   HGB 13.2 06/13/2016   HCT 38.7 06/13/2016   MCV 91.5 06/13/2016   PLT 247.0 06/13/2016   Lab Results  Component Value Date   NA 141 06/13/2016   K 4.3 06/13/2016   CO2 29 06/13/2016   GLUCOSE 90 06/13/2016   BUN 16 06/13/2016  CREATININE 0.75 06/13/2016   BILITOT 0.4 06/13/2016   ALKPHOS 56 06/13/2016   AST 18 06/13/2016   ALT 15 06/13/2016   PROT 7.1 06/13/2016   ALBUMIN 4.3 06/13/2016   CALCIUM 9.5 06/13/2016   GFR 83.36 06/13/2016   Lab Results  Component Value Date   CHOL 168 06/13/2016   Lab Results  Component Value Date   HDL 56.90 06/13/2016   Lab Results  Component Value Date   LDLCALC 96 06/13/2016   Lab Results  Component Value Date   TRIG 77.0 06/13/2016   Lab Results  Component Value Date   CHOLHDL 3 06/13/2016   No results found for: HGBA1C     Assessment & Plan:   Problem List Items Addressed This Visit    Hyperlipidemia    Encouraged heart healthy diet, increase exercise, avoid trans fats, consider a krill oil cap daily      Relevant Medications   rosuvastatin (CRESTOR) 5 MG tablet   Other Relevant Orders   Lipid panel   Preventative health care    Patient encouraged to maintain heart healthy diet, regular exercise, adequate sleep. Consider daily probiotics. Take medications as prescribed. Given and reviewed copy of ACP documents from [employer] and encouraged to complete and return      Relevant Orders   CBC   Comprehensive metabolic panel   TSH   Hepatitis C Antibody   Constipation    Doing  better no longer using miralax         I have discontinued Ms. Martorana's estradiol, acetaminophen, isometheptene-acetaminophen-dichloralphenazone, fluconazole, fluticasone, and azithromycin. I am also having her maintain her timolol and rosuvastatin.  Meds ordered this encounter  Medications  . rosuvastatin (CRESTOR) 5 MG tablet    Sig: Take 1 tablet (5 mg total) by mouth daily.    Dispense:  90 tablet    Refill:  3    Penni Homans, MD

## 2016-08-26 ENCOUNTER — Other Ambulatory Visit: Payer: Self-pay | Admitting: Family Medicine

## 2016-08-26 DIAGNOSIS — Z1231 Encounter for screening mammogram for malignant neoplasm of breast: Secondary | ICD-10-CM

## 2016-09-18 ENCOUNTER — Ambulatory Visit: Payer: Commercial Managed Care - HMO

## 2016-10-06 ENCOUNTER — Ambulatory Visit
Admission: RE | Admit: 2016-10-06 | Discharge: 2016-10-06 | Disposition: A | Discharge status: Home / Self Care | Payer: Commercial Managed Care - HMO | Source: Ambulatory Visit | Attending: Family Medicine | Admitting: Family Medicine

## 2016-10-06 DIAGNOSIS — Z1231 Encounter for screening mammogram for malignant neoplasm of breast: Secondary | ICD-10-CM

## 2016-10-08 ENCOUNTER — Encounter: Payer: Self-pay | Admitting: Gastroenterology

## 2016-10-13 ENCOUNTER — Encounter: Payer: Self-pay | Admitting: Gastroenterology

## 2016-11-19 ENCOUNTER — Ambulatory Visit (AMBULATORY_SURGERY_CENTER): Payer: Self-pay

## 2016-11-19 VITALS — Ht 62.5 in | Wt 130.4 lb

## 2016-11-19 DIAGNOSIS — Z1211 Encounter for screening for malignant neoplasm of colon: Secondary | ICD-10-CM

## 2016-11-19 MED ORDER — SUPREP BOWEL PREP KIT 17.5-3.13-1.6 GM/177ML PO SOLN: 1.0000 | Freq: Once | ORAL | 0 refills | Status: AC

## 2016-11-19 NOTE — Progress Notes (Signed)
No allergies to eggs or soy No diet meds No home oxygen No past problems with anesthesia  Registered emmi 

## 2016-11-21 ENCOUNTER — Encounter: Payer: Self-pay | Admitting: Gastroenterology

## 2016-12-03 ENCOUNTER — Ambulatory Visit (AMBULATORY_SURGERY_CENTER): Payer: Commercial Managed Care - HMO | Admitting: Gastroenterology

## 2016-12-03 ENCOUNTER — Encounter: Payer: Self-pay | Admitting: Gastroenterology

## 2016-12-03 VITALS — BP 104/61 | HR 68 | Temp 98.6°F | Resp 12 | Ht 62.5 in | Wt 130.0 lb

## 2016-12-03 DIAGNOSIS — Z1211 Encounter for screening for malignant neoplasm of colon: Secondary | ICD-10-CM | POA: Diagnosis not present

## 2016-12-03 DIAGNOSIS — Z1212 Encounter for screening for malignant neoplasm of rectum: Secondary | ICD-10-CM

## 2016-12-03 MED ORDER — SODIUM CHLORIDE 0.9 % IV SOLN: 500.0000 mL | INTRAVENOUS | Status: DC

## 2016-12-03 NOTE — Progress Notes (Signed)
A and O x3. Report to RN. Tolerated MAC anesthesia well.

## 2016-12-03 NOTE — Patient Instructions (Signed)
YOU HAD AN ENDOSCOPIC PROCEDURE TODAY AT THE [facility]:   Refer to the procedure report that was given to you for any specific questions about what was found during the examination.  If the procedure report does not answer your questions, please call your gastroenterologist to clarify.  If you requested that your care partner not be given the details of your procedure findings, then the procedure report has been included in a sealed envelope for you to review at your convenience later.  YOU SHOULD EXPECT: Some feelings of bloating in the abdomen. Passage of more gas than usual.  Walking can help get rid of the air that was put into your GI tract during the procedure and reduce the bloating. If you had a lower endoscopy (such as a colonoscopy or flexible sigmoidoscopy) you may notice spotting of blood in your stool or on the toilet paper. If you underwent a bowel prep for your procedure, you may not have a normal bowel movement for a few days.  Please Note:  You might notice some irritation and congestion in your nose or some drainage.  This is from the oxygen used during your procedure.  There is no need for concern and it should clear up in a day or so.  SYMPTOMS TO REPORT IMMEDIATELY:   Following lower endoscopy (colonoscopy or flexible sigmoidoscopy):  Excessive amounts of blood in the stool  Significant tenderness or worsening of abdominal pains  Swelling of the abdomen that is new, acute  Fever of 100F or higher  For urgent or emergent issues, a gastroenterologist can be reached at any hour by calling [phone].   DIET:  We do recommend a small meal at first, but then you may proceed to your regular diet.  Drink plenty of fluids but you should avoid alcoholic beverages for 24 hours.  ACTIVITY:  You should plan to take it easy for the rest of today and you should NOT DRIVE or use heavy machinery until tomorrow (because of the sedation medicines used during the test).     FOLLOW UP: Our staff will call the number listed on your records the next business day following your procedure to check on you and address any questions or concerns that you may have regarding the information given to you following your procedure. If we do not reach you, we will leave a message.  However, if you are feeling well and you are not experiencing any problems, there is no need to return our call.  We will assume that you have returned to your regular daily activities without incident.  If any biopsies were taken you will be contacted by phone or by letter within the next 1-3 weeks.  Please call us at [phone] if you have not heard about the biopsies in 3 weeks.   Repeat Colonoscopy screening in 10 years Diverticulosis (handout given) Hemorrhoids (handout given)  SIGNATURES/CONFIDENTIALITY: You and/or your care partner have signed paperwork which will be entered into your electronic medical record.  These signatures attest to the fact that that the information above on your After Visit Summary has been reviewed and is understood.  Full responsibility of the confidentiality of this discharge information lies with you and/or your care-partner. 

## 2016-12-03 NOTE — Op Note (Signed)
[place] Patient Name: Alicia Summers Procedure Date: 12/03/2016 11:18 AM MRN: [id number] Endoscopist: Ladene Artist , MD Age: 61 Referring MD:  Date of Birth: [birth year] Gender: Female Account #: [id number] Procedure:                Colonoscopy Indications:              Screening for colorectal malignant neoplasm, Last                            colonoscopy 10 years ago Medicines:                Monitored Anesthesia Care Procedure:                Pre-Anesthesia Assessment:                           - Prior to the procedure, a History and Physical                            was performed, and patient medications and                            allergies were reviewed. The patient's tolerance of                            previous anesthesia was also reviewed. The risks                            and benefits of the procedure and the sedation                            options and risks were discussed with the patient.                            All questions were answered, and informed consent                            was obtained. Prior Anticoagulants: The patient has                            taken no previous anticoagulant or antiplatelet                            agents. ASA Grade Assessment: II - A patient with                            mild systemic disease. After reviewing the risks                            and benefits, the patient was deemed in                            satisfactory condition to undergo the procedure.  After obtaining informed consent, the colonoscope                            was passed under direct vision. Throughout the                            procedure, the patient's blood pressure, pulse, and                            oxygen saturations were monitored continuously. The                            Colonoscope was introduced through the anus and                            advanced to the the cecum,  identified by                            appendiceal orifice and ileocecal valve. The                            ileocecal valve, appendiceal orifice, and rectum                            were photographed. The quality of the bowel                            preparation was excellent. The colonoscopy was                            performed without difficulty. The patient tolerated                            the procedure well. Scope In: 11:27:12 AM Scope Out: 67:67:20 AM Scope Withdrawal Time: 0 hours 7 minutes 59 seconds  Total Procedure Duration: 0 hours 9 minutes 35 seconds  Findings:                 The perianal and digital rectal examinations were                            normal.                           Multiple medium-mouthed diverticula were found in                            the left colon. There was no evidence of                            diverticular bleeding.                           Internal hemorrhoids were found during  retroflexion. The hemorrhoids were medium-sized and                            Grade I (internal hemorrhoids that do not prolapse).                           The exam was otherwise without abnormality on                            direct and retroflexion views. Complications:            No immediate complications. Estimated blood loss:                            None. Estimated Blood Loss:     Estimated blood loss: none. Impression:               - Mild diverticulosis in the left colon. There was                            no evidence of diverticular bleeding.                           - Internal hemorrhoids.                           - The examination was otherwise normal on direct                            and retroflexion views.                           - No specimens collected. Recommendation:           - Repeat colonoscopy in 10 years for screening                            purposes.                           -  Patient has a contact number available for                            emergencies. The signs and symptoms of potential                            delayed complications were discussed with the                            patient. Return to normal activities tomorrow.                            Written discharge instructions were provided to the                            patient.                           -  Continue present medications.                           - High fiber diet indefinitely. Ladene Artist, MD 12/03/2016 11:40:44 AM This report has been signed electronically.

## 2016-12-04 ENCOUNTER — Telehealth: Payer: Self-pay

## 2016-12-04 ENCOUNTER — Telehealth: Payer: Self-pay | Admitting: *Deleted

## 2016-12-04 NOTE — Telephone Encounter (Signed)
  Follow up Call-  Call back number 12/03/2016  Post procedure Call Back phone  # [phone]  Permission to leave phone message Yes  Some recent data might be hidden     Patient questions:  Do you have a fever, pain , or abdominal swelling? No. Pain Score  0 *  Have you tolerated food without any problems? Yes.    Have you been able to return to your normal activities? Yes.    Do you have any questions about your discharge instructions: Diet   No. Medications  No. Follow up visit  No.  Do you have questions or concerns about your Care? No.  Actions: * If pain score is 4 or above: No action needed, pain <4.

## 2016-12-04 NOTE — Telephone Encounter (Signed)
  Follow up Call-  Call back number 12/03/2016  Post procedure Call Back phone  # [phone]  Permission to leave phone message Yes  Some recent data might be hidden     No answer at # given.  Left message on VM.

## 2016-12-24 ENCOUNTER — Encounter: Payer: Self-pay | Admitting: Family Medicine

## 2016-12-30 ENCOUNTER — Encounter: Payer: Self-pay | Admitting: Family Medicine

## 2016-12-30 ENCOUNTER — Ambulatory Visit (INDEPENDENT_AMBULATORY_CARE_PROVIDER_SITE_OTHER): Payer: 59 | Admitting: Family Medicine

## 2016-12-30 DIAGNOSIS — M79604 Pain in right leg: Secondary | ICD-10-CM

## 2016-12-30 NOTE — Patient Instructions (Addendum)
Your exam is reassuring. This is consistent with an irritated nerve in your low back feeding into the thigh muscles. Take the prednisone dose pack you have until it's gone. Ok to take tylenol for baseline pain relief (1-2 extra strength tabs 3x/day) as well. Stay as active as possible. Do home exercises/stretches daily. 3 sets of 10 of the exercises, hold stretches for 20-30 seconds and repeat 3 times. Add knee extension, straight leg raise, hamstring curls, and hamstring swings also. Add ankle weight if these become too easy. Physical therapy has been shown to be helpful as well - let me know if you want to do this. Yoga is an excellent way to strengthen your core and has been shown to decrease pain and improve function. Strengthening of low back muscles, abdominal musculature are key for long term pain relief. Your labwork from December was normal. Follow up with me in 1 month for reevaluation.

## 2017-01-02 DIAGNOSIS — M79604 Pain in right leg: Secondary | ICD-10-CM | POA: Insufficient documentation

## 2017-01-02 NOTE — Assessment & Plan Note (Signed)
patient's exam is reassuring.  Could not reproduce pain with palpation, resisted motions.  No evidence stress fracture.  Concern about possible radiculopathy causing referred pain into right leg.  We discussed options - she would like to try prednisone dose pack.  Reviewed home exercises to do daily.  Consider physical therapy.  F/u in 1 month.

## 2017-01-02 NOTE — Progress Notes (Signed)
PCP: Mosie Lukes, MD  Subjective:   HPI: Patient is a 61 y.o. female here for right thigh pain.  Patient reports for past couple months she's had pain into right thigh, worse especially past month. Pain 0/10 level, up to 6/10 at times and sharp. Worse with standing from sitting, turning, using stairs. No swelling or bruising. Worse after workouts. Also worse during day. No night pain. Gets frequent cramps as well despite drinking water, electrolyte supplements of magnesium, calcium, zinc. Labwork was normal in December. No skin changes, numbness.  Past Medical History:  Diagnosis Date  . Acute bacterial sinusitis    dx 01-22-2015  . Arthritis   . Borderline glaucoma    bilatera eyes  . Constipation 06/17/2015  . History of colon polyps   . Hydronephrosis, right   . Hyperlipidemia   . Migraine   . Preventative health care 06/15/2015  . Right flank pain    intermittant  . Wears glasses     Current Outpatient Prescriptions on File Prior to Visit  Medication Sig Dispense Refill  . cetirizine (ZYRTEC) 10 MG tablet Take 10 mg by mouth daily.    . fluticasone (FLONASE) 50 MCG/ACT nasal spray Place 2 sprays into both nostrils daily.    Marland Kitchen MEGARED OMEGA-3 KRILL OIL PO Take by mouth.    . Multiple Minerals-Vitamins (CALCIUM-MAGNESIUM-ZINC-D3 PO) Take by mouth.    . polyethylene glycol (MIRALAX / GLYCOLAX) packet Take 17 g by mouth daily.    . rosuvastatin (CRESTOR) 5 MG tablet Take 1 tablet (5 mg total) by mouth daily. 90 tablet 3  . timolol (BETIMOL) 0.25 % ophthalmic solution Place 1-2 drops into both eyes 2 (two) times daily.      Current Facility-Administered Medications on File Prior to Visit  Medication Dose Route Frequency Provider Last Rate Last Dose  . 0.9 %  sodium chloride infusion  500 mL Intravenous Continuous Ladene Artist, MD        Past Surgical History:  Procedure Laterality Date  . ABDOMINAL HYSTERECTOMY   1997   w/ Bladder suspension  . AUGMENTATION  MAMMAPLASTY Bilateral 2003  . BREAST ENHANCEMENT SURGERY Bilateral 2003  . COLONOSCOPY  03-04-2007  . CYSTOSCOPY WITH RETROGRADE PYELOGRAM, URETEROSCOPY AND STENT PLACEMENT Right 01/24/2015   Procedure: CYSTOSCOPY WITH RIGHT RETROGRADE PYELOGRAM, DIAGNOSTIC URETEROSCOPY ;  Surgeon: Cleon Gustin, MD;  Location: [facility];  Service: Urology;  Laterality: Right;  [place] [id number]  . TUBAL LIGATION      Allergies  Allergen Reactions  . Dilaudid [Hydromorphone Hcl] Itching    Social History   Social History  . Marital status: Married    Spouse name: N/A  . Number of children: 2  . Years of education: N/A   Occupational History  . Not on file.   Social History Main Topics  . Smoking status: Never Smoker  . Smokeless tobacco: Never Used  . Alcohol use 1.2 oz/week    2 Glasses of wine per week  . Drug use: No  . 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 avoid dairy   Other Topics Concern  . Not on file   Social History Narrative  . No narrative on file    Family History  Problem Relation Age of Onset  . COPD Mother   . Prostate cancer Father   . Colon cancer Maternal Grandmother   . Colon  cancer Paternal Grandfather   . Alcohol abuse Sister   . Cirrhosis Sister   . Breast cancer Neg Hx     BP 138/87   Pulse 63   Ht 5\' 2"  (1.575 m)   Wt 135 lb (61.2 kg)   LMP 10/18/1995   BMI 24.69 kg/m   Review of Systems: See HPI above.     Objective:  Physical Exam:  Gen: NAD, comfortable in exam room  Back/right leg: No gross deformity, scoliosis. No back, leg TTP .  No midline or bony TTP. FROM back, hip without pain. Strength LEs 5/5 all muscle groups.   2+ MSRs in patellar and achilles tendons, equal bilaterally. Negative SLRs. Sensation intact to light touch bilaterally. Negative logroll bilateral hips Negative fabers and piriformis  stretches. Negative fulcrum and hop tests.   Assessment & Plan:  1. Right leg pain - patient's exam is reassuring.  Could not reproduce pain with palpation, resisted motions.  No evidence stress fracture.  Concern about possible radiculopathy causing referred pain into right leg.  We discussed options - she would like to try prednisone dose pack.  Reviewed home exercises to do daily.  Consider physical therapy.  F/u in 1 month.

## 2017-01-22 ENCOUNTER — Ambulatory Visit: Payer: Commercial Managed Care - HMO | Admitting: Family Medicine

## 2017-01-29 ENCOUNTER — Ambulatory Visit: Payer: 59 | Admitting: Family Medicine

## 2017-02-05 ENCOUNTER — Encounter: Payer: Self-pay | Admitting: Family Medicine

## 2017-02-05 ENCOUNTER — Ambulatory Visit (INDEPENDENT_AMBULATORY_CARE_PROVIDER_SITE_OTHER): Payer: 59 | Admitting: Family Medicine

## 2017-02-05 DIAGNOSIS — M79604 Pain in right leg: Secondary | ICD-10-CM | POA: Diagnosis not present

## 2017-02-06 NOTE — Progress Notes (Signed)
PCP: Mosie Lukes, MD  Subjective:   HPI: Patient is a 61 y.o. female here for right thigh pain.  6/19: Patient reports for past couple months she's had pain into right thigh, worse especially past month. Pain 0/10 level, up to 6/10 at times and sharp. Worse with standing from sitting, turning, using stairs. No swelling or bruising. Worse after workouts. Also worse during day. No night pain. Gets frequent cramps as well despite drinking water, electrolyte supplements of magnesium, calcium, zinc. Labwork was normal in December. No skin changes, numbness.  7/26: Patient reports she feels at least 80% improved. She feels pain if stands up and turns at times. Took prednisone and is still doing home exercises. Uses lidocaine patches and takes aleve. Pain level 0/10 currently. Back did tighten up earlier this week but improved. No skin changes, numbness.  Past Medical History:  Diagnosis Date  . Acute bacterial sinusitis    dx 01-22-2015  . Arthritis   . Borderline glaucoma    bilatera eyes  . Constipation 06/17/2015  . History of colon polyps   . Hydronephrosis, right   . Hyperlipidemia   . Migraine   . Preventative health care 06/15/2015  . Right flank pain    intermittant  . Wears glasses     Current Outpatient Prescriptions on File Prior to Visit  Medication Sig Dispense Refill  . cetirizine (ZYRTEC) 10 MG tablet Take 10 mg by mouth daily.    . fluticasone (FLONASE) 50 MCG/ACT nasal spray Place 2 sprays into both nostrils daily.    Marland Kitchen MEGARED OMEGA-3 KRILL OIL PO Take by mouth.    . Multiple Minerals-Vitamins (CALCIUM-MAGNESIUM-ZINC-D3 PO) Take by mouth.    . polyethylene glycol (MIRALAX / GLYCOLAX) packet Take 17 g by mouth daily.    . rosuvastatin (CRESTOR) 5 MG tablet Take 1 tablet (5 mg total) by mouth daily. 90 tablet 3  . timolol (BETIMOL) 0.25 % ophthalmic solution Place 1-2 drops into both eyes 2 (two) times daily.      Current Facility-Administered  Medications on File Prior to Visit  Medication Dose Route Frequency Provider Last Rate Last Dose  . 0.9 %  sodium chloride infusion  500 mL Intravenous Continuous Ladene Artist, MD        Past Surgical History:  Procedure Laterality Date  . ABDOMINAL HYSTERECTOMY   1997   w/ Bladder suspension  . AUGMENTATION MAMMAPLASTY Bilateral 2003  . BREAST ENHANCEMENT SURGERY Bilateral 2003  . COLONOSCOPY  03-04-2007  . CYSTOSCOPY WITH RETROGRADE PYELOGRAM, URETEROSCOPY AND STENT PLACEMENT Right 01/24/2015   Procedure: CYSTOSCOPY WITH RIGHT RETROGRADE PYELOGRAM, DIAGNOSTIC URETEROSCOPY ;  Surgeon: Cleon Gustin, MD;  Location: [facility];  Service: Urology;  Laterality: Right;  [place] [id number]  . TUBAL LIGATION      Allergies  Allergen Reactions  . Dilaudid [Hydromorphone Hcl] Itching    Social History   Social History  . Marital status: Married    Spouse name: N/A  . Number of children: 2  . Years of education: N/A   Occupational History  . Not on file.   Social History Main Topics  . Smoking status: Never Smoker  . Smokeless tobacco: Never Used  . Alcohol use 1.2 oz/week    2 Glasses of wine per week  . Drug use: No  . 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 Not on file   Social History Narrative  . No narrative on file    Family History  Problem Relation Age of Onset  . COPD Mother   . Prostate cancer Father   . Colon cancer Maternal Grandmother   . Colon cancer Paternal Grandfather   . Alcohol abuse Sister   . Cirrhosis Sister   . Breast cancer Neg Hx     BP 131/84   Pulse (!) 59   Ht 5\' 2"  (1.575 m)   Wt 135 lb (61.2 kg)   LMP 10/18/1995   BMI 24.69 kg/m   Review of Systems: See HPI above.     Objective:  Physical Exam:  Gen: NAD, comfortable in exam room  Back/right leg: No gross  deformity, scoliosis. No back, leg TTP .  No midline or bony TTP. FROM back, hip without pain. Strength LEs 5/5 all muscle groups.   2+ MSRs in patellar and achilles tendons, equal bilaterally. Negative SLRs. Sensation intact to light touch bilaterally. Negative logroll bilateral hips Negative fabers and piriformis stretches.   Assessment & Plan:  1. Right leg pain - patient's exam is reassuring.  Much improved with prednisone dose pack and home exercises.  Encouraged aleve as needed, home exercises for 4-6 more weeks.  Call us with any concerns otherwise follow up as needed.

## 2017-02-06 NOTE — Assessment & Plan Note (Signed)
patient's exam is reassuring.  Much improved with prednisone dose pack and home exercises.  Encouraged aleve as needed, home exercises for 4-6 more weeks.  Call us with any concerns otherwise follow up as needed.

## 2017-03-31 ENCOUNTER — Telehealth: Payer: 59 | Admitting: Family

## 2017-03-31 DIAGNOSIS — K2100 Gastro-esophageal reflux disease with esophagitis, without bleeding: Secondary | ICD-10-CM

## 2017-03-31 MED ORDER — OMEPRAZOLE 20 MG PO CPDR: 20.0000 mg | DELAYED_RELEASE_CAPSULE | Freq: Every day | ORAL | 0 refills | Status: DC

## 2017-03-31 NOTE — Progress Notes (Signed)
We are sorry that you are not feeling well.  Here is how we plan to help!  Based on what you shared with me it looks like you most likely have Gastroesophageal Reflux Disease (GERD)  Gastroesophageal reflux disease (GERD) happens when acid from your stomach flows up into the esophagus.  When acid comes in contact with the esophagus, the acid causes sorenss (inflammation) in the esophagus.  Over time, GERD may create small holes (ulcers) in the lining of the esophagus.  I have prescribed Omeprazole, a Protein Pump inhibitor, 20 mg daily until you follow up with a provider. You have a complex case that includes constipation and excessive gas. See you primary care provider within the next 7-10days  Your symptoms should improve in the next day or two.  You can use antacids as needed until symptoms resolve.  Call us if your heartburn worsens, you have trouble swallowing, weight loss, spitting up blood or recurrent vomiting.  Home Care:  May include lifestyle changes such as weight loss, quitting smoking and alcohol consumption  Avoid foods and drinks that make your symptoms worse, such as:  Caffeine or alcoholic drinks  Chocolate  Peppermint or mint flavorings  Garlic and onions  Spicy foods  Citrus fruits, such as oranges, lemons, or limes  Tomato-based foods such as sauce, chili, salsa and pizza  Fried and fatty foods  Avoid lying down for 3 hours prior to your bedtime or prior to taking a nap  Eat small, frequent meals instead of a large meals  Wear loose-fitting clothing.  Do not wear anything tight around your waist that causes pressure on your stomach.  Raise the head of your bed 6 to 8 inches with wood blocks to help you sleep.  Extra pillows will not help.  Seek Help Right Away If:  You have pain in your arms, neck, jaw, teeth or back  Your pain increases or changes in intensity or duration  You develop nausea, vomiting or sweating (diaphoresis)  You develop  shortness of breath or you faint  Your vomit is green, yellow, black or looks like coffee grounds or blood  Your stool is red, bloody or black  These symptoms could be signs of other problems, such as heart disease, gastric bleeding or esophageal bleeding.  Make sure you :  Understand these instructions.  Will watch your condition.  Will get help right away if you are not doing well or get worse.  Your e-visit answers were reviewed by a board certified advanced clinical practitioner to complete your personal care plan.  Depending on the condition, your plan could have included both over the counter or prescription medications.  If there is a problem please reply  once you have received a response from your provider.  Your safety is important to [place].  If you have drug allergies check your prescription carefully.    You can use MyChart to ask questions about today's visit, request a non-urgent call back, or ask for a work or school excuse for 24 hours related to this e-Visit. If it has been greater than 24 hours you will need to follow up with your provider, or enter a new e-Visit to address those concerns.  You will get an e-mail in the next two days asking about your experience.  I hope that your e-visit has been valuable and will speed your recovery. Thank you for using e-visits.

## 2017-04-13 ENCOUNTER — Ambulatory Visit (INDEPENDENT_AMBULATORY_CARE_PROVIDER_SITE_OTHER): Payer: 59 | Admitting: Family Medicine

## 2017-04-13 ENCOUNTER — Encounter: Payer: Self-pay | Admitting: Family Medicine

## 2017-04-13 VITALS — BP 128/82 | HR 92 | Temp 98.1°F | Ht 62.0 in | Wt 136.2 lb

## 2017-04-13 DIAGNOSIS — A084 Viral intestinal infection, unspecified: Secondary | ICD-10-CM

## 2017-04-13 MED ORDER — DICYCLOMINE HCL 10 MG PO CAPS: 10.0000 mg | ORAL_CAPSULE | Freq: Three times a day (TID) | ORAL | 0 refills | Status: DC

## 2017-04-13 MED ORDER — ONDANSETRON HCL 4 MG PO TABS: 4.0000 mg | ORAL_TABLET | Freq: Three times a day (TID) | ORAL | 0 refills | Status: DC | PRN

## 2017-04-13 NOTE — Progress Notes (Signed)
Pre visit review using our clinic review tool, if applicable. No additional management support is needed unless otherwise documented below in the visit note. 

## 2017-04-13 NOTE — Patient Instructions (Addendum)
Drink Gatorade while you are still having diarrhea.  If you are still having issues with diarrhea tomorrow, call and we will order a stool culture.   Let us know if you need anything.

## 2017-04-13 NOTE — Progress Notes (Signed)
Chief Complaint  Patient presents with  . Abdominal Cramping  . Diarrhea  . Headache    Alicia Summers is here for abdominal pain.  Duration: 1 week, cramping and diarrhea started 2 days ago Bleeding? No Palliation: Aleve helped a little Provocation: Eating Associated symptoms: nausea and diarrhea Denies: fever, vomiting and inability to keep down fluids Treatment to date: Aleve No sick contacts, med changes, recent travel, abx use.    ROS: Constitutional: No fevers GI: No N/V/C, no bleeding + pain  Past Medical History:  Diagnosis Date  . Acute bacterial sinusitis    dx 01-22-2015  . Arthritis   . Borderline glaucoma    bilatera eyes  . Constipation 06/17/2015  . History of colon polyps   . Hydronephrosis, right   . Hyperlipidemia   . Migraine   . Preventative health care 06/15/2015  . Right flank pain    intermittant  . Wears glasses    Family History  Problem Relation Age of Onset  . COPD Mother   . Prostate cancer Father   . Colon cancer Maternal Grandmother   . Colon cancer Paternal Grandfather   . Alcohol abuse Sister   . Cirrhosis Sister   . Breast cancer Neg Hx    Past Surgical History:  Procedure Laterality Date  . ABDOMINAL HYSTERECTOMY   1997   w/ Bladder suspension  . AUGMENTATION MAMMAPLASTY Bilateral 2003  . BREAST ENHANCEMENT SURGERY Bilateral 2003  . COLONOSCOPY  03-04-2007  . CYSTOSCOPY WITH RETROGRADE PYELOGRAM, URETEROSCOPY AND STENT PLACEMENT Right 01/24/2015   Procedure: CYSTOSCOPY WITH RIGHT RETROGRADE PYELOGRAM, DIAGNOSTIC URETEROSCOPY ;  Surgeon: Cleon Gustin, MD;  Location: [facility];  Service: Urology;  Laterality: Right;  [place] [id number]  . TUBAL LIGATION      BP 128/82 (BP Location: Left Arm, Patient Position: Sitting, Cuff Size: Normal)   Pulse 92   Temp 98.1 F (36.7 C) (Oral)   Ht 5\' 2"  (1.575 m)   Wt 136 lb 4 oz (61.8 kg)   LMP 10/18/1995   SpO2 99%   BMI 24.92 kg/m  Gen.:  Awake, alert, appears stated age 61: Mucous membranes moist without mucosal lesions Heart: Regular rate and rhythm without murmurs Lungs: Clear auscultation bilaterally, no rales or wheezing, normal effort without accessory muscle use. Abdomen: Bowel sounds are present. Abdomen is soft, TTP in lower quadrants, nondistended, no masses or organomegaly. Negative Murphy's, Rovsing's, McBurney's, and Carnett's sign. Psych: Age appropriate judgment and insight. Normal mood and affect.  Viral gastroenteritis - Plan: ondansetron (ZOFRAN) 4 MG tablet, dicyclomine (BENTYL) 10 MG capsule  Orders as above. Stay hydrated. Gatorade while still having diarrhea. Call tomorrow if diarrhea not improving, will culture stool.  F/u prn. Pt voiced understanding and agreement to the plan.  [name], DO 04/13/17 12:20 PM

## 2017-04-15 ENCOUNTER — Encounter: Payer: Self-pay | Admitting: Family Medicine

## 2017-05-08 ENCOUNTER — Ambulatory Visit: Payer: Self-pay | Admitting: Family Medicine

## 2017-06-16 DIAGNOSIS — M2042 Other hammer toe(s) (acquired), left foot: Secondary | ICD-10-CM | POA: Insufficient documentation

## 2017-06-23 ENCOUNTER — Encounter: Payer: Self-pay | Admitting: Family Medicine

## 2017-06-24 ENCOUNTER — Other Ambulatory Visit: Payer: 59

## 2017-06-26 ENCOUNTER — Telehealth: Payer: Self-pay

## 2017-06-26 NOTE — Telephone Encounter (Signed)
Patient scheduled with [facility]    Copied from [place]. Topic: Appointment Scheduling - Scheduling Inquiry for Clinic >> Jun 23, 2017  4:42 PM Bea Graff, NT wrote: Reason for CRM: Patient was scheduled for her physical on 06/23/17 and labs for 06/24/17 but would like labs before her physical. I show no other physical appts available with Dr. Charlett Blake until January, can she be worked in? She needs this appt before the end of the year. Willing to see another physician is possible.

## 2017-06-29 ENCOUNTER — Other Ambulatory Visit (INDEPENDENT_AMBULATORY_CARE_PROVIDER_SITE_OTHER): Payer: 59

## 2017-06-29 DIAGNOSIS — Z0000 Encounter for general adult medical examination without abnormal findings: Secondary | ICD-10-CM | POA: Diagnosis not present

## 2017-06-29 DIAGNOSIS — E785 Hyperlipidemia, unspecified: Secondary | ICD-10-CM | POA: Diagnosis not present

## 2017-06-29 LAB — COMPREHENSIVE METABOLIC PANEL
ALBUMIN: 4.2 g/dL (ref 3.5–5.2)
ALT: 14 U/L (ref 0–35)
AST: 18 U/L (ref 0–37)
Alkaline Phosphatase: 53 U/L (ref 39–117)
BILIRUBIN TOTAL: 0.5 mg/dL (ref 0.2–1.2)
BUN: 17 mg/dL (ref 6–23)
CALCIUM: 9.2 mg/dL (ref 8.4–10.5)
CHLORIDE: 105 meq/L (ref 96–112)
CO2: 29 mEq/L (ref 19–32)
Creatinine, Ser: 0.78 mg/dL (ref 0.40–1.20)
GFR: 79.4 mL/min (ref >60.00)
Glucose, Bld: 92 mg/dL (ref 70–99)
Potassium: 4.1 mEq/L (ref 3.5–5.1)
Sodium: 140 mEq/L (ref 135–145)
Total Protein: 6.9 g/dL (ref 6.0–8.3)

## 2017-06-29 LAB — CBC
HEMATOCRIT: 38.8 % (ref 36.0–46.0)
Hemoglobin: 13 g/dL (ref 12.0–15.0)
MCHC: 33.4 g/dL (ref 30.0–36.0)
MCV: 93.3 fl (ref 78.0–100.0)
PLATELETS: 259 10*3/uL (ref 150.0–400.0)
RBC: 4.16 Mil/uL (ref 3.87–5.11)
RDW: 12.8 % (ref 11.5–15.5)
WBC: 4.6 10*3/uL (ref 4.0–10.5)

## 2017-06-29 LAB — LIPID PANEL
CHOL/HDL RATIO: 2
Cholesterol: 151 mg/dL (ref 0–200)
HDL: 66.9 mg/dL (ref >39.00)
LDL CALC: 72 mg/dL (ref 0–99)
NonHDL: 84.1
TRIGLYCERIDES: 63 mg/dL (ref 0.0–149.0)
VLDL: 12.6 mg/dL (ref 0.0–40.0)

## 2017-06-29 LAB — TSH: TSH: 2.05 u[IU]/mL (ref 0.35–4.50)

## 2017-06-30 LAB — HEPATITIS C ANTIBODY
Hepatitis C Ab: NONREACTIVE
SIGNAL TO CUT-OFF: 0.01 (ref <1.00)

## 2017-07-01 ENCOUNTER — Encounter: Payer: Self-pay | Admitting: Family

## 2017-07-01 ENCOUNTER — Ambulatory Visit: Payer: 59 | Admitting: Family

## 2017-07-01 VITALS — BP 144/85 | HR 73 | Temp 97.8°F | Resp 16 | Ht 62.5 in | Wt 137.0 lb

## 2017-07-01 DIAGNOSIS — N952 Postmenopausal atrophic vaginitis: Secondary | ICD-10-CM

## 2017-07-01 DIAGNOSIS — R030 Elevated blood-pressure reading, without diagnosis of hypertension: Secondary | ICD-10-CM

## 2017-07-01 DIAGNOSIS — Z23 Encounter for immunization: Secondary | ICD-10-CM | POA: Diagnosis not present

## 2017-07-01 DIAGNOSIS — R252 Cramp and spasm: Secondary | ICD-10-CM

## 2017-07-01 DIAGNOSIS — Z0000 Encounter for general adult medical examination without abnormal findings: Secondary | ICD-10-CM

## 2017-07-01 DIAGNOSIS — E348 Other specified endocrine disorders: Secondary | ICD-10-CM

## 2017-07-01 MED ORDER — ESTRADIOL 0.1 MG/GM VA CREA: TOPICAL_CREAM | VAGINAL | 11 refills | Status: DC

## 2017-07-01 NOTE — Progress Notes (Signed)
Subjective:    Patient ID: Alicia Summers, female    DOB: May 05, 1956, 61 y.o.   MRN: [id number]  HPI  Patient presents today for complete physical.  Immunizations:  Flu shot today. Due for Tdap Diet:  healthy Exercise:  Enjoys exercise, hiking, [employer] Colonoscopy: 5/18 Dexa: due Pap Smear: hysterectomy Mammogram:3/18  Vaginal dryness- reports dryness, pain with intercourse, has been on estrogen oral and vaginal in the past. Interested in restarting vaginal estrogen. Denies family hx of breast cancer.  Elevated blood pressure- reports that she has been taking pseudafed due to nasal congestion  Leg cramping- mostly at night. Drinks a lot of water, takes otc mag/K supplement without improvement.   BP Readings from Last 3 Encounters:  07/01/17 (!) 155/83  04/13/17 128/82  02/05/17 131/84     Review of Systems  Constitutional: Negative for unexpected weight change.  HENT: Positive for rhinorrhea.   Respiratory: Negative for cough.   Cardiovascular: Negative for leg swelling.  Gastrointestinal: Negative for constipation and diarrhea.  Genitourinary: Negative for dysuria.       Mild "bladder pressure" for a few months. Denies incontinence  Musculoskeletal:       Some OA in hips/knees, shoulders stable  Skin: Negative for rash.  Neurological: Negative for headaches.  Hematological: Negative for adenopathy.  Psychiatric/Behavioral:       Denies depression/anxiety       Past Medical History:  Diagnosis Date  . Acute bacterial sinusitis    dx 01-22-2015  . Arthritis   . Borderline glaucoma    bilatera eyes  . Constipation 06/17/2015  . History of colon polyps   . Hydronephrosis, right   . Hyperlipidemia   . Migraine   . Preventative health care 06/15/2015  . Right flank pain    intermittant  . Wears glasses      Social History   Socioeconomic History  . Marital status: Married    Spouse name: Not on file  . Number of children: 2  . Years of education: Not  on file  . Highest education level: Not on file  Social Needs  . Financial resource strain: Not on file  . Food insecurity - worry: Not on file  . Food insecurity - inability: Not on file  . Transportation needs - medical: Not on file  . Transportation needs - non-medical: Not on file  Occupational History  . Not on file  Tobacco Use  . Smoking status: Never Smoker  . Smokeless tobacco: Never Used  Substance and Sexual Activity  . Alcohol use: Yes    Alcohol/week: 1.2 oz    Types: 2 Glasses of wine per week  . Drug use: No  . 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 Not on file  Social History Narrative  . Not on file    Past Surgical History:  Procedure Laterality Date  . ABDOMINAL HYSTERECTOMY   1997   w/ Bladder suspension  . AUGMENTATION MAMMAPLASTY Bilateral 2003  . BREAST ENHANCEMENT SURGERY Bilateral 2003  . COLONOSCOPY  03-04-2007  . CYSTOSCOPY WITH RETROGRADE PYELOGRAM, URETEROSCOPY AND STENT PLACEMENT Right 01/24/2015   Procedure: CYSTOSCOPY WITH RIGHT RETROGRADE PYELOGRAM, DIAGNOSTIC URETEROSCOPY ;  Surgeon: Cleon Gustin, MD;  Location: [facility];  Service: Urology;  Laterality: Right;  [place] [id number]  . TUBAL LIGATION      Family History  Problem Relation Age of Onset  . COPD Mother   . Prostate cancer Father   . Colon cancer Maternal Grandmother   . Colon cancer Paternal Grandfather   . Alcohol abuse Sister   . Cirrhosis Sister   . Breast cancer Neg Hx     Allergies  Allergen Reactions  . Dilaudid [Hydromorphone Hcl] Itching    Current Outpatient Medications on File Prior to Visit  Medication Sig Dispense Refill  . cetirizine (ZYRTEC) 10 MG tablet Take 10 mg by mouth daily.    . fluticasone (FLONASE) 50 MCG/ACT nasal spray Place 2 sprays into both nostrils daily.    . potassium  bicarbonate (K-LYTE) 25 MEQ disintegrating tablet Take 25 mEq by mouth 2 (two) times daily.    . rosuvastatin (CRESTOR) 5 MG tablet Take 1 tablet (5 mg total) by mouth daily. 90 tablet 3  . timolol (BETIMOL) 0.25 % ophthalmic solution Place into both eyes 2 (two) times daily.     . timolol (TIMOPTIC) 0.25 % ophthalmic solution Apply to eye.     Current Facility-Administered Medications on File Prior to Visit  Medication Dose Route Frequency Provider Last Rate Last Dose  . 0.9 %  sodium chloride infusion  500 mL Intravenous Continuous Ladene Artist, MD        BP (!) 155/83 (BP Location: Left Arm, Patient Position: Sitting, Cuff Size: Small)   Pulse 73   Temp 97.8 F (36.6 C) (Oral)   Resp 16   Ht 5' 2.5" (1.588 m)   Wt 137 lb (62.1 kg)   LMP 10/18/1995   SpO2 100%   BMI 24.66 kg/m    Objective:   Physical Exam   Physical Exam  Constitutional: She is oriented to person, place, and time. She appears well-developed and well-nourished. No distress.  HENT:  Head: Normocephalic and atraumatic.  Right Ear: Tympanic membrane and ear canal normal.  Left Ear: Tympanic membrane and ear canal normal.  Mouth/Throat: Oropharynx is clear and moist.  Eyes: Pupils are equal, round, and reactive to light. No scleral icterus.  Neck: Normal range of motion. No thyromegaly present.  Cardiovascular: Normal rate and regular rhythm.   No murmur heard. Pulmonary/Chest: Effort normal and breath sounds normal. No respiratory distress. He has no wheezes. She has no rales. She exhibits no tenderness.  Abdominal: Soft. Bowel sounds are normal. She exhibits no distension and no mass. There is no tenderness. There is no rebound and no guarding.  Musculoskeletal: She exhibits no edema.  Lymphadenopathy:    She has no cervical adenopathy.  Neurological: She is alert and oriented to person, place, and time. She has normal patellar reflexes. She exhibits normal muscle tone. Coordination normal.  Skin: Skin  is warm and dry.  Psychiatric: She has a normal mood and affect. Her behavior is normal. Judgment and thought content normal.  Breasts: Examined lying (bilateral breast implants noted) Right: Without masses, retractions, discharge or axillary adenopathy.  Left: Without masses, retractions, discharge or axillary adenopathy.        Assessment & Plan:           Assessment & Plan:  Preventative Care- discussed healthy diet, exercise, weight loss.  Flu shot and tdap today. Refer for dexa.  mammo up to date.   EKG tracing is personally reviewed.  EKG notes NSR.  No acute changes.   Leg cramping- electrolytes stable on labs. We discussed stretching to do at bedtime to see if this helps.  Atrophic vaginitis- trial of  estrace cream. Discussed risks/benefits of estrogen therapy and she wishes to proceed.  Elevated BP reading-  BP Readings from Last 3 Encounters:  07/01/17 (!) 144/85  04/13/17 128/82  02/05/17 131/84   Follow up bp is improved but still mildly elevated. Advised pt to d/c sudafed and follow up with PCP in 3 months for bp recheck and follow up on her atrophic vaginitis.

## 2017-07-01 NOTE — Patient Instructions (Addendum)
Please begin estrace cream. Stop sudafed.

## 2017-07-15 ENCOUNTER — Telehealth: Payer: 59 | Admitting: Family

## 2017-07-15 DIAGNOSIS — J019 Acute sinusitis, unspecified: Secondary | ICD-10-CM

## 2017-07-15 MED ORDER — AMOXICILLIN-POT CLAVULANATE 875-125 MG PO TABS: 1.0000 | ORAL_TABLET | Freq: Two times a day (BID) | ORAL | 0 refills | Status: DC

## 2017-07-15 NOTE — Progress Notes (Signed)

## 2017-07-22 ENCOUNTER — Encounter: Payer: Self-pay | Admitting: Family

## 2017-07-23 ENCOUNTER — Encounter: Payer: Self-pay | Admitting: Family Medicine

## 2017-07-23 ENCOUNTER — Ambulatory Visit: Payer: 59 | Admitting: Family Medicine

## 2017-07-23 VITALS — BP 120/80 | HR 72 | Temp 98.0°F | Ht 62.0 in | Wt 138.1 lb

## 2017-07-23 DIAGNOSIS — H6122 Impacted cerumen, left ear: Secondary | ICD-10-CM

## 2017-07-23 NOTE — Patient Instructions (Signed)
OK to use Debrox (peroxide) in the ear to loosen up wax. Also recommend using a bulb syringe (for removing boogers from baby's noses) to flush through warm water. Do not use Q-tips as this can impact wax further.  Let us know if you need anything. 

## 2017-07-23 NOTE — Progress Notes (Signed)
Chief Complaint  Patient presents with  . Ear Fullness    Pt is here for left ear fullness. Several weeks. Brought up at CPE, but got sidetracked and ended up trying to treat at home. No pain, decreased hearing and fullness. No URI symptoms or fevers. She does not use Q tips.   ROS:  HEENT: +ear fullness Costitutional: Denies fevers  Past Medical History:  Diagnosis Date  . Acute bacterial sinusitis    dx 01-22-2015  . Arthritis   . Borderline glaucoma    bilatera eyes  . Constipation 06/17/2015  . History of colon polyps   . Hydronephrosis, right   . Hyperlipidemia   . Migraine   . Preventative health care 06/15/2015  . Right flank pain    intermittant  . Wears glasses       BP 120/80 (BP Location: Left Arm, Patient Position: Sitting, Cuff Size: Normal)   Pulse 72   Temp 98 F (36.7 C) (Oral)   Ht 5\' 2"  (1.575 m)   Wt 138 lb 2 oz (62.7 kg)   LMP 10/18/1995   SpO2 97%   BMI 25.26 kg/m  General: Awake, alert, appearing stated age 21:  L ear- Initially 100% cerumen obstruction, after flush canal patent without drainage or erythema, TM is neg R ear- canal patent without drainage or erythema, TM is neg Lungs: Normal effort, no accessory muscle use Psych: Age appropriate judgment and insight, normal mood and affect  Procedure note: Cerumen removal; performed by Sharon Seller, CMA Verbal consent obtained Ear irrigated with successful removal of wax Pt tolerated procedure well.  I personally examined ear confirming removal.  No immediate complications noted.   Impacted cerumen of left ear  Home removal info provided. Successfully removed today.  F/u prn. Pt voiced understanding and agreement to the plan.  [place], DO 07/23/17 9:55 AM

## 2017-07-23 NOTE — Progress Notes (Signed)
Pre visit review using our clinic review tool, if applicable. No additional management support is needed unless otherwise documented below in the visit note. 

## 2017-07-29 ENCOUNTER — Ambulatory Visit (HOSPITAL_BASED_OUTPATIENT_CLINIC_OR_DEPARTMENT_OTHER)
Admission: RE | Admit: 2017-07-29 | Discharge: 2017-07-29 | Disposition: A | Discharge status: Home / Self Care | Payer: 59 | Source: Ambulatory Visit | Attending: Family | Admitting: Family

## 2017-07-29 DIAGNOSIS — E348 Other specified endocrine disorders: Secondary | ICD-10-CM | POA: Diagnosis present

## 2017-07-31 ENCOUNTER — Encounter: Payer: Self-pay | Admitting: Family

## 2017-07-31 ENCOUNTER — Encounter: Payer: Self-pay | Admitting: Neurology

## 2017-07-31 DIAGNOSIS — R253 Fasciculation: Secondary | ICD-10-CM

## 2017-08-12 ENCOUNTER — Telehealth: Payer: 59 | Admitting: Family

## 2017-08-12 DIAGNOSIS — B3731 Acute candidiasis of vulva and vagina: Secondary | ICD-10-CM

## 2017-08-12 DIAGNOSIS — B373 Candidiasis of vulva and vagina: Secondary | ICD-10-CM

## 2017-08-12 MED ORDER — FLUCONAZOLE 150 MG PO TABS: 150.0000 mg | ORAL_TABLET | ORAL | 0 refills | Status: DC | PRN

## 2017-08-12 NOTE — Progress Notes (Signed)

## 2017-08-21 ENCOUNTER — Telehealth: Payer: 59 | Admitting: Family

## 2017-08-21 DIAGNOSIS — B3731 Acute candidiasis of vulva and vagina: Secondary | ICD-10-CM

## 2017-08-21 DIAGNOSIS — B373 Candidiasis of vulva and vagina: Secondary | ICD-10-CM

## 2017-08-21 NOTE — Progress Notes (Signed)
Thank you for the details you included in the comment boxes. Those details are very helpful in determining the best course of treatment for you and help [place] to provide the best care.. Metrogel would not help your current situation. That is for bacterial vaginosis which is bacterial. With severe itching without discharge, and also taking bubble baths, this is nearly always a yeast infection, which would be treated with Diflucan. In other words, the metrogel really isn't necessary. We can prescribe a single pill below and it should clear this up.  Addendum: Pt already has 3 diflucan from 10 days ago to use. Reached out to the patient.   Addendum 2: Made contact with patient. Discussed possible causes. May also be a reaction to the new Estradiol vaginal cream she just started a few weeks ago. She has no more discharge; is reaching out to her PCP to ask for a clear gel rather than a colored cream; has stopped bubble baths. Presents more like residual inflammation from the yeast infection, (discharge did resolve when she took the Diflucan a few days ago).   We are sorry that you are not feeling well. Here is how we plan to help! Based on what you shared with me it looks like you: May have a yeast vaginosis  Vaginosis is an inflammation of the vagina that can result in discharge, itching and pain. The cause is usually a change in the normal balance of vaginal bacteria or an infection. Vaginosis can also result from reduced estrogen levels after menopause.  The most common causes of vaginosis are:   Bacterial vaginosis which results from an overgrowth of one on several organisms that are normally present in your vagina.   Yeast infections which are caused by a naturally occurring fungus called candida.   Vaginal atrophy (atrophic vaginosis) which results from the thinning of the vagina from reduced estrogen levels after menopause.   Trichomoniasis which is caused by a parasite and is commonly transmitted  by sexual intercourse.  Factors that increase your risk of developing vaginosis include: Marland Kitchen Medications, such as antibiotics and steroids . Uncontrolled diabetes . Use of hygiene products such as bubble bath, vaginal spray or vaginal deodorant . Douching . Wearing damp or tight-fitting clothing . Using an intrauterine device (IUD) for birth control . Hormonal changes, such as those associated with pregnancy, birth control pills or menopause . Sexual activity . Having a sexually transmitted infection  Your treatment plan is A single Diflucan (fluconazole) 150mg  tablet once.  I have electronically sent this prescription into the pharmacy that you have chosen.  Be sure to take all of the medication as directed. Stop taking any medication if you develop a rash, tongue swelling or shortness of breath. Mothers who are breast feeding should consider pumping and discarding their breast milk while on these antibiotics. However, there is no consensus that infant exposure at these doses would be harmful.  Remember that medication creams can weaken latex condoms. Marland Kitchen   HOME CARE:  Good hygiene may prevent some types of vaginosis from recurring and may relieve some symptoms:  . Avoid baths, hot tubs and whirlpool spas. Rinse soap from your outer genital area after a shower, and dry the area well to prevent irritation. Don't use scented or harsh soaps, such as those with deodorant or antibacterial action. Marland Kitchen Avoid irritants. These include scented tampons and pads. . Wipe from front to back after using the toilet. Doing so avoids spreading fecal bacteria to your vagina.  Other  things that may help prevent vaginosis include:  Marland Kitchen Don't douche. Your vagina doesn't require cleansing other than normal bathing. Repetitive douching disrupts the normal organisms that reside in the vagina and can actually increase your risk of vaginal infection. Douching won't clear up a vaginal infection. . Use a latex condom.  Both female and female latex condoms may help you avoid infections spread by sexual contact. . Wear cotton underwear. Also wear pantyhose with a cotton crotch. If you feel comfortable without it, skip wearing underwear to bed. Yeast thrives in [employer] Your symptoms should improve in the next day or two.  GET HELP RIGHT AWAY IF:  . You have pain in your lower abdomen ( pelvic area or over your ovaries) . You develop nausea or vomiting . You develop a fever . Your discharge changes or worsens . You have persistent pain with intercourse . You develop shortness of breath, a rapid pulse, or you faint.  These symptoms could be signs of problems or infections that need to be evaluated by a medical provider now.  MAKE SURE YOU    Understand these instructions.  Will watch your condition.  Will get help right away if you are not doing well or get worse.  Your e-visit answers were reviewed by a board certified advanced clinical practitioner to complete your personal care plan. Depending upon the condition, your plan could have included both over the counter or prescription medications. Please review your pharmacy choice to make sure that you have choses a pharmacy that is open for you to pick up any needed prescription, Your safety is important to [place]. If you have drug allergies check your prescription carefully.   You can use MyChart to ask questions about today's visit, request a non-urgent call back, or ask for a work or school excuse for 24 hours related to this e-Visit. If it has been greater than 24 hours you will need to follow up with your provider, or enter a new e-Visit to address those concerns. You will get a MyChart message within the next two days asking about your experience. I hope that your e-visit has been valuable and will speed your recovery.

## 2017-08-27 ENCOUNTER — Ambulatory Visit: Payer: 59 | Admitting: Family Medicine

## 2017-08-28 ENCOUNTER — Other Ambulatory Visit (HOSPITAL_COMMUNITY)
Admission: RE | Admit: 2017-08-28 | Discharge: 2017-08-28 | Disposition: A | Discharge status: Home / Self Care | Payer: 59 | Source: Ambulatory Visit | Attending: Family Medicine | Admitting: Family Medicine

## 2017-08-28 ENCOUNTER — Ambulatory Visit: Payer: 59 | Admitting: Family Medicine

## 2017-08-28 ENCOUNTER — Encounter: Payer: Self-pay | Admitting: Family Medicine

## 2017-08-28 VITALS — BP 102/62 | HR 79 | Temp 98.0°F | Ht 62.0 in | Wt 134.2 lb

## 2017-08-28 DIAGNOSIS — G43909 Migraine, unspecified, not intractable, without status migrainosus: Secondary | ICD-10-CM | POA: Diagnosis not present

## 2017-08-28 DIAGNOSIS — E785 Hyperlipidemia, unspecified: Secondary | ICD-10-CM | POA: Diagnosis not present

## 2017-08-28 DIAGNOSIS — R0989 Other specified symptoms and signs involving the circulatory and respiratory systems: Secondary | ICD-10-CM | POA: Insufficient documentation

## 2017-08-28 DIAGNOSIS — R300 Dysuria: Secondary | ICD-10-CM | POA: Insufficient documentation

## 2017-08-28 LAB — POC URINALSYSI DIPSTICK (AUTOMATED)
BILIRUBIN UA: NEGATIVE
Glucose, UA: NEGATIVE
KETONES UA: NEGATIVE
Leukocytes, UA: NEGATIVE
Nitrite, UA: NEGATIVE
PH UA: 6 (ref 5.0–8.0)
Protein, UA: NEGATIVE
RBC UA: NEGATIVE
Spec Grav, UA: 1.01 (ref 1.010–1.025)
Urobilinogen, UA: 0.2 E.U./dL

## 2017-08-28 NOTE — Progress Notes (Signed)
Pre visit review using our clinic review tool, if applicable. No additional management support is needed unless otherwise documented below in the visit note. 

## 2017-08-28 NOTE — Progress Notes (Signed)
Chief Complaint  Patient presents with  . Urinary Tract Infection  . Dysuria    Alicia Summers is a 62 y.o. female here for possible UTI.  Duration: 3 days. Symptoms: urgency, pressure, itching, burning Denies: hematuria, urinary hesitancy, fever, abd pain, vaginal discharge Hx of recurrent UTI? No Denies new sexual partners. Has been tx'd for yeast infection without relief.   ROS:  Constitutional: denies fever GU: As noted in HPI  Past Medical History:  Diagnosis Date  . Acute bacterial sinusitis    dx 01-22-2015  . Arthritis   . Borderline glaucoma    bilatera eyes  . Constipation 06/17/2015  . History of colon polyps   . Hydronephrosis, right   . Hyperlipidemia   . Migraine   . Preventative health care 06/15/2015  . Right flank pain    intermittant  . Wears glasses     BP 102/62 (BP Location: Left Arm, Patient Position: Sitting, Cuff Size: Normal)   Pulse 79   Temp 98 F (36.7 C) (Oral)   Ht 5\' 2"  (1.575 m)   Wt 134 lb 4 oz (60.9 kg)   LMP 10/18/1995   SpO2 93%   BMI 24.55 kg/m  General: Awake, alert, appears stated age 51: MMM Heart: RRR Lungs: CTAB, normal respiratory effort, no accessory muscle usage Abd: BS+, soft, pressure sensation on palpation in suprapubic region, ND, prominent abd aortic pulse MSK: No CVA tenderness, neg Lloyd's sign Psych: Age appropriate judgment and insight  Dysuria - Plan: POCT Urinalysis Dipstick (Automated), Urine cytology ancillary only, Urine Culture  Prominent abdominal aortic pulse - Plan: US Abdomen Limited  Orders as above. Stay hydrated. If culture neg, will empirically tx for BV while awaiting ancillary. Seek immediate care if pt starts to develop fevers, new/worsening symptoms, uncontrollable N/V. Ck [place] of abd given prominent pulse. F/u prn. The patient voiced understanding and agreement to the plan.  [place], DO 08/28/17 4:58 PM

## 2017-08-28 NOTE — Patient Instructions (Signed)
For now, assume no news is good news. If the culture is negative, I will call in an antibiotic for bacterial vaginosis. If the culture is positive, we will call in an antibiotic for a UTI. Further instructions to come via MyChart.  If you do not hear anything about your [place] in the next week, call our office and ask for an update.  Let us know if you need anything.

## 2017-08-29 LAB — URINE CULTURE
MICRO NUMBER:: 90205463
Result:: NO GROWTH
SPECIMEN QUALITY:: ADEQUATE

## 2017-08-30 ENCOUNTER — Other Ambulatory Visit: Payer: Self-pay | Admitting: Family Medicine

## 2017-08-30 MED ORDER — METRONIDAZOLE 500 MG PO TABS: 500.0000 mg | ORAL_TABLET | Freq: Two times a day (BID) | ORAL | 0 refills | Status: AC

## 2017-08-31 LAB — URINE CYTOLOGY ANCILLARY ONLY: Trichomonas: NEGATIVE

## 2017-09-02 LAB — URINE CYTOLOGY ANCILLARY ONLY
BACTERIAL VAGINITIS: NEGATIVE
Candida vaginitis: NEGATIVE

## 2017-09-07 ENCOUNTER — Other Ambulatory Visit: Payer: Self-pay | Admitting: Family Medicine

## 2017-09-07 ENCOUNTER — Ambulatory Visit (HOSPITAL_BASED_OUTPATIENT_CLINIC_OR_DEPARTMENT_OTHER)
Admission: RE | Admit: 2017-09-07 | Discharge: 2017-09-07 | Disposition: A | Discharge status: Home / Self Care | Payer: 59 | Source: Ambulatory Visit | Attending: Family Medicine | Admitting: Family Medicine

## 2017-09-07 DIAGNOSIS — R0989 Other specified symptoms and signs involving the circulatory and respiratory systems: Secondary | ICD-10-CM

## 2017-10-26 ENCOUNTER — Encounter: Payer: Self-pay | Admitting: Neurology

## 2017-10-26 ENCOUNTER — Ambulatory Visit: Payer: 59 | Admitting: Neurology

## 2017-10-26 ENCOUNTER — Other Ambulatory Visit: Payer: 59

## 2017-10-26 ENCOUNTER — Other Ambulatory Visit: Payer: Self-pay

## 2017-10-26 VITALS — BP 150/82 | HR 60 | Ht 62.5 in | Wt 133.0 lb

## 2017-10-26 DIAGNOSIS — R252 Cramp and spasm: Secondary | ICD-10-CM | POA: Diagnosis not present

## 2017-10-26 DIAGNOSIS — G2581 Restless legs syndrome: Secondary | ICD-10-CM

## 2017-10-26 DIAGNOSIS — R292 Abnormal reflex: Secondary | ICD-10-CM

## 2017-10-26 DIAGNOSIS — R202 Paresthesia of skin: Secondary | ICD-10-CM

## 2017-10-26 MED ORDER — GABAPENTIN 100 MG PO CAPS: ORAL_CAPSULE | ORAL | 5 refills | Status: DC

## 2017-10-26 NOTE — Patient Instructions (Addendum)
Start gabapentin 100mg  - take 1 tablet at bedtime x 1 week, then increase to 2 tablet at bedtime.   You can try drinking 2-3 glasses of tonic water  Check labs   Please send me a MyChart message in 3 - 4 weeks  If your cramps do not improve, the next step will be MRI lumbar spine  Return to clinic 4 months

## 2017-10-26 NOTE — Progress Notes (Signed)
[facility] Clinic Note - Initial Visit   Date: 10/26/17  Alicia Summers MRN: [id number] DOB: Nov 23, 1955   Dear Judi Saa, NP:  Thank you for your kind referral of Alicia Summers for consultation of cramps. Although her history is well known to you, please allow [place] to reiterate it for the purpose of our medical record. The patient was accompanied to the clinic by self.   History of Present Illness: Alicia Summers is a 62 y.o. right-handed Caucasian female with hypertension, hyperlipidemia, RLS, and glaucoma presenting for evaluation of muscle cramps.    She has had restless leg syndrome for about 10 years, described as the urge to constant want to move the legs, with creepy crawling sensation.  She often wakes up at night to walk around which relieves her discomfort.  She has never taken any medications for this.  She has identified that alcohol and caffeine can trigger her symptoms.  For the past year, she has muscle cramps and twitches which occur several times per week.  She has painful cramps involving the feet, lower legs, and thighs.  Prolonged sitting and alcohol may trigger these.  She tries to stay well-hydrated and engages in yoga exercises, hoping that stretching would help.  She also complains of low back pain, which is worse on the right.  Also over the same time, she has mild numbness and tingling of the toes.  She has tried acupuncture which has helped. She stopped crestor 5mg  in December 2018, but has not appreciated any improvement.     Out-side paper records, electronic medical record, and images have been reviewed where available and summarized as:  Lab Results  Component Value Date   CHOL 151 06/29/2017   HDL 66.90 06/29/2017   LDLCALC 72 06/29/2017   TRIG 63.0 06/29/2017   CHOLHDL 2 06/29/2017    Lab Results  Component Value Date   TSH 2.05 06/29/2017   Lab Results  Component Value Date   CREATININE 0.78 06/29/2017     BUN 17 06/29/2017   NA 140 06/29/2017   K 4.1 06/29/2017   CL 105 06/29/2017   CO2 29 06/29/2017    Past Medical History:  Diagnosis Date  . Acute bacterial sinusitis    dx 01-22-2015  . Arthritis   . Borderline glaucoma    bilatera eyes  . Constipation 06/17/2015  . History of colon polyps   . Hydronephrosis, right   . Hyperlipidemia   . Migraine   . Preventative health care 06/15/2015  . Right flank pain    intermittant  . Wears glasses     Past Surgical History:  Procedure Laterality Date  . ABDOMINAL HYSTERECTOMY   1997   w/ Bladder suspension  . AUGMENTATION MAMMAPLASTY Bilateral 2003  . BREAST ENHANCEMENT SURGERY Bilateral 2003  . COLONOSCOPY  03-04-2007  . CYSTOSCOPY WITH RETROGRADE PYELOGRAM, URETEROSCOPY AND STENT PLACEMENT Right 01/24/2015   Procedure: CYSTOSCOPY WITH RIGHT RETROGRADE PYELOGRAM, DIAGNOSTIC URETEROSCOPY ;  Surgeon: Cleon Gustin, MD;  Location: [facility];  Service: Urology;  Laterality: Right;  [id number]  . TUBAL LIGATION       Medications:  Outpatient Encounter Medications as of 10/26/2017  Medication Sig  . estradiol (ESTRACE) 0.1 MG/GM vaginal cream Insert 2g daily vaginally for 1 week, then reduce to 1 g daily for 2 weeks, followed by a maintenance dose of 1 g  1-2 times per week.  . fluconazole (DIFLUCAN) 150 MG tablet Take 1 tablet (150  mg total) by mouth every three (3) days as needed.  . fluticasone (FLONASE) 50 MCG/ACT nasal spray Place 2 sprays into both nostrils daily.  . potassium bicarbonate (K-LYTE) 25 MEQ disintegrating tablet Take 25 mEq by mouth 2 (two) times daily.  . rosuvastatin (CRESTOR) 5 MG tablet Take 1 tablet (5 mg total) by mouth daily.  . timolol (BETIMOL) 0.25 % ophthalmic solution Place into both eyes 2 (two) times daily.   Marland Kitchen gabapentin (NEURONTIN) 100 MG capsule Take 1 tablet at bedtime x 1 week, then increase to 2 tablet at bedtime.   Facility-Administered Encounter  Medications as of 10/26/2017  Medication  . 0.9 %  sodium chloride infusion     Allergies:  Allergies  Allergen Reactions  . Dilaudid [Hydromorphone Hcl] Itching    Family History: Family History  Problem Relation Age of Onset  . COPD Mother   . Prostate cancer Father   . Colon cancer Maternal Grandmother   . Colon cancer Paternal Grandfather   . Alcohol abuse Sister   . Cirrhosis Sister   . Breast cancer Neg Hx     Social History: Social History   Tobacco Use  . Smoking status: Never Smoker  . Smokeless tobacco: Never Used  Substance Use Topics  . Alcohol use: Yes    Alcohol/week: 1.2 oz    Types: 2 Glasses of wine per week  . Drug use: No   Social History   Social History Narrative   Pt lives in 3 story home with her husband   Has 2 adult children   Some college   retired    Review of Systems:  CONSTITUTIONAL: No fevers, chills, night sweats, or weight loss.   EYES: No visual changes or eye pain ENT: No hearing changes.  No history of nose bleeds.   RESPIRATORY: No cough, wheezing and shortness of breath.   CARDIOVASCULAR: Negative for chest pain, and palpitations.   GI: Negative for abdominal discomfort, blood in stools or black stools.  No recent change in bowel habits.   GU:  No history of incontinence.   MUSCLOSKELETAL: No history of joint pain or swelling.  No myalgias.   SKIN: Negative for lesions, rash, and itching.   HEMATOLOGY/ONCOLOGY: Negative for prolonged bleeding, bruising easily, and swollen nodes.  No history of cancer.   ENDOCRINE: Negative for cold or heat intolerance, polydipsia or goiter.   PSYCH:  No depression or anxiety symptoms.   NEURO: As Above.   Vital Signs:  BP (!) 150/82   Pulse 60   Ht 5' 2.5" (1.588 m)   Wt 133 lb (60.3 kg)   LMP 10/18/1995   SpO2 99%   BMI 23.94 kg/m    General Medical Exam:   General:  Well appearing, comfortable.   Eyes/ENT: see cranial nerve examination.   Neck: No masses appreciated.  Full  range of motion without tenderness.  No carotid bruits. Respiratory:  Clear to auscultation, good air entry bilaterally.   Cardiac:  Regular rate and rhythm, no murmur.   Extremities:  No deformities, edema, or skin discoloration.  Skin:  No rashes or lesions.  Neurological Exam: MENTAL STATUS including orientation to time, place, person, recent and remote memory, attention span and concentration, language, and fund of knowledge is normal.  Speech is not dysarthric.  CRANIAL NERVES: II:  No visual field defects.  Unremarkable fundi.   III-IV-VI: Pupils equal round and reactive to light.  Normal conjugate, extra-ocular eye movements in all directions of gaze.  No nystagmus.  No ptosis.   V:  Normal facial sensation.     VII:  Normal facial symmetry and movements.  No pathologic facial reflexes.  VIII:  Normal hearing and vestibular function.   IX-X:  Normal palatal movement.   XI:  Normal shoulder shrug and head rotation.   XII:  Normal tongue strength and range of motion, no deviation or fasciculation.  MOTOR:  No atrophy, fasciculations or abnormal movements.  No pronator drift.  Tone is normal.    Right Upper Extremity:    Left Upper Extremity:    Deltoid  5/5   Deltoid  5/5   Biceps  5/5   Biceps  5/5   Triceps  5/5   Triceps  5/5   Wrist extensors  5/5   Wrist extensors  5/5   Wrist flexors  5/5   Wrist flexors  5/5   Finger extensors  5/5   Finger extensors  5/5   Finger flexors  5/5   Finger flexors  5/5   Dorsal interossei  5/5   Dorsal interossei  5/5   Abductor pollicis  5/5   Abductor pollicis  5/5   Tone (Ashworth scale)  0  Tone (Ashworth scale)  0   Right Lower Extremity:    Left Lower Extremity:    Hip flexors  5/5   Hip flexors  5/5   Hip extensors  5/5   Hip extensors  5/5   Knee flexors  5/5   Knee flexors  5/5   Knee extensors  5/5   Knee extensors  5/5   Dorsiflexors  5/5   Dorsiflexors  5/5   Plantarflexors  5/5   Plantarflexors  5/5   Toe extensors  5/5    Toe extensors  5/5   Toe flexors  5/5   Toe flexors  5/5   Tone (Ashworth scale)  0  Tone (Ashworth scale)  0   MSRs:  Right                                                                 Left brachioradialis 2+  brachioradialis 2+  biceps 2+  biceps 2+  triceps 2+  triceps 2+  patellar 3+  patellar 3+  ankle jerk 2+  ankle jerk 2+  Hoffman no  Hoffman no  plantar response down  plantar response down   SENSORY:  Normal and symmetric perception of light touch, pinprick, vibration, and proprioception.  Romberg's sign absent.   COORDINATION/GAIT: Normal finger-to- nose-finger and heel-to-shin.  Intact rapid alternating movements bilaterally.  Able to rise from a chair without using arms.  Gait narrow based and stable. Tandem and stressed gait intact.    IMPRESSION: 1.  Muscle cramps, possibly due to suspected lumbar canal stenosis, less likely statin-induced as she has seen no improvemeent with drug holiday.  - TSH and electrolytes are within normal limits  - Check CK, vitamin B12, vitamin B1, Mg  - Encouraged to stay well hydrated and continue stretching exercises  - Try drinking 2-3 glasses of tonic water in the evening  - Start gabapentin 100mg  at bedtime x 1 week, then increase to 200mg  at bedtime.  Titrate further, if tolerable  - If no improvement, the next step is MRI lumbar  spine  2.  Restless leg syndrome  - Check ferritin  - Gabapentin is 2nd line therapy for RLS, so hopefully using this for cramps, may also provide some relief with RLS  - If not, she will be started on a dopamine agonist  3.  Feet paresthesias, most likely related to RLS.  No signs of neuropathy on her exam  She was asked to send a MyChart message in 3-4 weeks with an update  Return to clinic in 4 months   Thank you for allowing me to participate in patient's care.  If I can answer any additional questions, I would be pleased to do so.    Sincerely,    Yentl Verge K. Posey Pronto, DO

## 2017-10-30 LAB — VITAMIN B1: Vitamin B1 (Thiamine): 13 nmol/L (ref 8–30)

## 2017-10-30 LAB — FERRITIN: Ferritin: 84 ng/mL (ref 20–288)

## 2017-10-30 LAB — VITAMIN B12: VITAMIN B 12: 334 pg/mL (ref 200–1100)

## 2017-10-30 LAB — MAGNESIUM: Magnesium: 2.1 mg/dL (ref 1.5–2.5)

## 2017-10-30 LAB — CK: Total CK: 103 U/L (ref 29–143)

## 2017-11-06 ENCOUNTER — Encounter: Payer: Self-pay | Admitting: Neurology

## 2017-11-25 ENCOUNTER — Other Ambulatory Visit: Payer: Self-pay

## 2017-11-25 MED ORDER — ROSUVASTATIN CALCIUM 5 MG PO TABS: 5.0000 mg | ORAL_TABLET | Freq: Every day | ORAL | 3 refills | Status: DC

## 2017-11-30 ENCOUNTER — Other Ambulatory Visit: Payer: Self-pay | Admitting: Family Medicine

## 2017-11-30 DIAGNOSIS — Z1231 Encounter for screening mammogram for malignant neoplasm of breast: Secondary | ICD-10-CM

## 2017-12-10 ENCOUNTER — Ambulatory Visit (HOSPITAL_BASED_OUTPATIENT_CLINIC_OR_DEPARTMENT_OTHER)
Admission: RE | Admit: 2017-12-10 | Discharge: 2017-12-10 | Disposition: A | Discharge status: Home / Self Care | Payer: 59 | Source: Ambulatory Visit | Attending: Family Medicine | Admitting: Family Medicine

## 2017-12-10 ENCOUNTER — Encounter: Payer: Self-pay | Admitting: Neurology

## 2017-12-10 DIAGNOSIS — Z1231 Encounter for screening mammogram for malignant neoplasm of breast: Secondary | ICD-10-CM | POA: Diagnosis present

## 2017-12-11 ENCOUNTER — Other Ambulatory Visit: Payer: Self-pay | Admitting: *Deleted

## 2017-12-11 DIAGNOSIS — R252 Cramp and spasm: Secondary | ICD-10-CM

## 2017-12-11 DIAGNOSIS — G2581 Restless legs syndrome: Secondary | ICD-10-CM

## 2017-12-11 MED ORDER — ROPINIROLE HCL 0.25 MG PO TABS: ORAL_TABLET | ORAL | 5 refills | Status: DC

## 2018-02-10 ENCOUNTER — Ambulatory Visit: Payer: 59 | Admitting: Family Medicine

## 2018-02-10 ENCOUNTER — Encounter: Payer: Self-pay | Admitting: Family Medicine

## 2018-02-10 VITALS — BP 132/82 | HR 70 | Temp 98.1°F | Ht 62.0 in | Wt 139.2 lb

## 2018-02-10 DIAGNOSIS — R519 Headache, unspecified: Secondary | ICD-10-CM

## 2018-02-10 DIAGNOSIS — R51 Headache: Secondary | ICD-10-CM

## 2018-02-10 MED ORDER — KETOROLAC TROMETHAMINE 30 MG/ML IJ SOLN
30.0000 mg | Freq: Once | INTRAMUSCULAR | Status: AC
  Administered 2018-02-10: 30 mg via INTRAMUSCULAR

## 2018-02-10 MED ORDER — NAPROXEN 500 MG PO TABS: 500.0000 mg | ORAL_TABLET | Freq: Two times a day (BID) | ORAL | 0 refills | Status: DC

## 2018-02-10 MED FILL — NAPROXEN 500 MG TABLET: 500 | 15 days supply | Qty: 30 | Fill #0

## 2018-02-10 NOTE — Progress Notes (Signed)
Chief Complaint  Patient presents with  . Headache     Alicia Summers is a 62 y.o. female here for evaluation of an acute headache.  Duration: 2 weeks Laterality: right and back of neck Quality: Pressure/tension  Severity: 7/10 Associated symptoms: neck tightness, has been feeling like a throbbing Denies vision changes, trouble swallowing, balance issues, numbness/tingling/weakness.  Therapies tried: Goody's and Tylenol Hx of migraines: No.  ROS:  Neuro: +HA MSK: + tension in neck  Past Medical History:  Diagnosis Date  . Acute bacterial sinusitis    dx 01-22-2015  . Arthritis   . Borderline glaucoma    bilatera eyes  . Constipation 06/17/2015  . History of colon polyps   . Hydronephrosis, right   . Hyperlipidemia   . Migraine   . Preventative health care 06/15/2015  . Right flank pain    intermittant  . Wears glasses    BP 132/82 (BP Location: Left Arm, Patient Position: Sitting, Cuff Size: Normal)   Pulse 70   Temp 98.1 F (36.7 C) (Oral)   Ht 5\' 2"  (1.575 m)   Wt 139 lb 4 oz (63.2 kg)   LMP 10/18/1995   SpO2 99%   BMI 25.47 kg/m  Gen: awake, alert, appearing stated age Eyes: PERRLA, EOMi, no injection Heart: RRR Lungs: CTAB, no accessory muscle use Abd: BS+, soft, NT, ND Neuro: CN2-12 grossly intact, fluent and goal-oriented speech, DTR's equal and symmetric in UE's and LE's MSK: 5/5 strength throughout, +TTP over occipital triangle region Psych: Age appropriate judgment and insight, normal affect and mood  Acute nonintractable headache, unspecified headache type - Plan: ketorolac (TORADOL) 30 MG/ML injection 30 mg  Nothing sinister on exam or in history. Heat over neck, stretches/exercises, anti-inflammatories, Tylenol.  F/u prn. The pt voiced understanding and agreement to the plan.  [place], DO 02/10/18 1:02 PM

## 2018-02-10 NOTE — Progress Notes (Signed)
Pre visit review using our clinic review tool, if applicable. No additional management support is needed unless otherwise documented below in the visit note. 

## 2018-02-10 NOTE — Patient Instructions (Signed)
Heat (pad or rice pillow in microwave) over affected area, 10-15 minutes twice daily.   OK to take Tylenol 1000 mg (2 extra strength tabs) or 975 mg (3 regular strength tabs) every 6 hours as needed.  Take medicine if you need it while you are in [place]. It might help with aches/pains anyway.  Let us know if you need anything.  EXERCISES RANGE OF MOTION (ROM) AND STRETCHING EXERCISES  These exercises may help you when beginning to rehabilitate your issue. In order to successfully resolve your symptoms, you must improve your posture. These exercises are designed to help reduce the forward-head and rounded-shoulder posture which contributes to this condition. Your symptoms may resolve with or without further involvement from your physician, physical therapist or athletic trainer. While completing these exercises, remember:   Restoring tissue flexibility helps normal motion to return to the joints. This allows healthier, less painful movement and activity.  An effective stretch should be held for at least 20 seconds, although you may need to begin with shorter hold times for comfort.  A stretch should never be painful. You should only feel a gentle lengthening or release in the stretched tissue.  Do not do any stretch or exercise that you cannot tolerate.  STRETCH- Axial Extensors  Lie on your back on the floor. You may bend your knees for comfort. Place a rolled-up hand towel or dish towel, about 2 inches in diameter, under the part of your head that makes contact with the floor.  Gently tuck your chin, as if trying to make a "double chin," until you feel a gentle stretch at the base of your head.  Hold 15-20 seconds. Repeat 2-3 times. Complete this exercise 1 time per day.   STRETCH - Axial Extension   Stand or sit on a firm surface. Assume a good posture: chest up, shoulders drawn back, abdominal muscles slightly tense, knees unlocked (if standing) and feet hip width apart.  Slowly  retract your chin so your head slides back and your chin slightly lowers. Continue to look straight ahead.  You should feel a gentle stretch in the back of your head. Be certain not to feel an aggressive stretch since this can cause headaches later.  Hold for 15-20 seconds. Repeat 2-3 times. Complete this exercise 1 time per day.  STRETCH - Cervical Side Bend   Stand or sit on a firm surface. Assume a good posture: chest up, shoulders drawn back, abdominal muscles slightly tense, knees unlocked (if standing) and feet hip width apart.  Without letting your nose or shoulders move, slowly tip your right / left ear to your shoulder until your feel a gentle stretch in the muscles on the opposite side of your neck.  Hold 15-20 seconds. Repeat 2-3 times. Complete this exercise 1-2 times per day.  STRETCH - Cervical Rotators   Stand or sit on a firm surface. Assume a good posture: chest up, shoulders drawn back, abdominal muscles slightly tense, knees unlocked (if standing) and feet hip width apart.  Keeping your eyes level with the ground, slowly turn your head until you feel a gentle stretch along the back and opposite side of your neck.  Hold 15-20 seconds. Repeat 2-3 times. Complete this exercise 1-2 times per day.  RANGE OF MOTION - Neck Circles   Stand or sit on a firm surface. Assume a good posture: chest up, shoulders drawn back, abdominal muscles slightly tense, knees unlocked (if standing) and feet hip width apart.  Gently roll your  head down and around from the back of one shoulder to the back of the other. The motion should never be forced or painful.  Repeat the motion 10-20 times, or until you feel the neck muscles relax and loosen. Repeat 2-3 times. Complete the exercise 1-2 times per day. STRENGTHENING EXERCISES - Cervical Strain and Sprain These exercises may help you when beginning to rehabilitate your injury. They may resolve your symptoms with or without further  involvement from your physician, physical therapist, or athletic trainer. While completing these exercises, remember:   Muscles can gain both the endurance and the strength needed for everyday activities through controlled exercises.  Complete these exercises as instructed by your physician, physical therapist, or athletic trainer. Progress the resistance and repetitions only as guided.  You may experience muscle soreness or fatigue, but the pain or discomfort you are trying to eliminate should never worsen during these exercises. If this pain does worsen, stop and make certain you are following the directions exactly. If the pain is still present after adjustments, discontinue the exercise until you can discuss the trouble with your clinician.  STRENGTH - Cervical Flexors, Isometric  Face a wall, standing about 6 inches away. Place a small pillow, a ball about 6-8 inches in diameter, or a folded towel between your forehead and the wall.  Slightly tuck your chin and gently push your forehead into the soft object. Push only with mild to moderate intensity, building up tension gradually. Keep your jaw and forehead relaxed.  Hold 10 to 20 seconds. Keep your breathing relaxed.  Release the tension slowly. Relax your neck muscles completely before you start the next repetition. Repeat 2-3 times. Complete this exercise 1 time per day.  STRENGTH- Cervical Lateral Flexors, Isometric   Stand about 6 inches away from a wall. Place a small pillow, a ball about 6-8 inches in diameter, or a folded towel between the side of your head and the wall.  Slightly tuck your chin and gently tilt your head into the soft object. Push only with mild to moderate intensity, building up tension gradually. Keep your jaw and forehead relaxed.  Hold 10 to 20 seconds. Keep your breathing relaxed.  Release the tension slowly. Relax your neck muscles completely before you start the next repetition. Repeat 2-3 times.  Complete this exercise 1 time per day.  STRENGTH - Cervical Extensors, Isometric   Stand about 6 inches away from a wall. Place a small pillow, a ball about 6-8 inches in diameter, or a folded towel between the back of your head and the wall.  Slightly tuck your chin and gently tilt your head back into the soft object. Push only with mild to moderate intensity, building up tension gradually. Keep your jaw and forehead relaxed.  Hold 10 to 20 seconds. Keep your breathing relaxed.  Release the tension slowly. Relax your neck muscles completely before you start the next repetition. Repeat 2-3 times. Complete this exercise 1 time per day.  POSTURE AND BODY MECHANICS CONSIDERATIONS Keeping correct posture when sitting, standing or completing your activities will reduce the stress put on different body tissues, allowing injured tissues a chance to heal and limiting painful experiences. The following are general guidelines for improved posture. Your physician or physical therapist will provide you with any instructions specific to your needs. While reading these guidelines, remember:  The exercises prescribed by your provider will help you have the flexibility and strength to maintain correct postures.  The correct posture provides the optimal  environment for your joints to work. All of your joints have less wear and tear when properly supported by a spine with good posture. This means you will experience a healthier, less painful body.  Correct posture must be practiced with all of your activities, especially prolonged sitting and standing. Correct posture is as important when doing repetitive low-stress activities (typing) as it is when doing a single heavy-load activity (lifting).  PROLONGED STANDING WHILE SLIGHTLY LEANING FORWARD When completing a task that requires you to lean forward while standing in one place for a long time, place either foot up on a stationary 2- to 4-inch high object to  help maintain the best posture. When both feet are on the ground, the low back tends to lose its slight inward curve. If this curve flattens (or becomes too large), then the back and your other joints will experience too much stress, fatigue more quickly, and can cause pain.   RESTING POSITIONS Consider which positions are most painful for you when choosing a resting position. If you have pain with flexion-based activities (sitting, bending, stooping, squatting), choose a position that allows you to rest in a less flexed posture. You would want to avoid curling into a fetal position on your side. If your pain worsens with extension-based activities (prolonged standing, working overhead), avoid resting in an extended position such as sleeping on your stomach. Most people will find more comfort when they rest with their spine in a more neutral position, neither too rounded nor too arched. Lying on a non-sagging bed on your side with a pillow between your knees, or on your back with a pillow under your knees will often provide some relief. Keep in mind, being in any one position for a prolonged period of time, no matter how correct your posture, can still lead to stiffness.  WALKING Walk with an upright posture. Your ears, shoulders, and hips should all line up. OFFICE WORK When working at a desk, create an environment that supports good, upright posture. Without extra support, muscles fatigue and lead to excessive strain on joints and other tissues.  CHAIR:  A chair should be able to slide under your desk when your back makes contact with the back of the chair. This allows you to work closely.  The chair's height should allow your eyes to be level with the upper part of your monitor and your hands to be slightly lower than your elbows.  Body position: ? Your feet should make contact with the floor. If this is not possible, use a foot rest. ? Keep your ears over your shoulders. This will reduce stress  on your neck and low back.

## 2018-02-22 ENCOUNTER — Ambulatory Visit: Payer: 59 | Admitting: Neurology

## 2018-03-05 ENCOUNTER — Encounter: Payer: Self-pay | Admitting: Family Medicine

## 2018-03-05 ENCOUNTER — Other Ambulatory Visit: Payer: Self-pay | Admitting: Family Medicine

## 2018-03-05 MED ORDER — PREDNISONE 5 MG (21) PO TBPK: ORAL_TABLET | ORAL | 0 refills | Status: DC

## 2018-03-05 MED FILL — predniSONE 5 MG TABS: 5 | 6 days supply | Qty: 21 | Fill #0

## 2018-05-31 ENCOUNTER — Ambulatory Visit: Payer: 59 | Admitting: Neurology

## 2018-06-07 ENCOUNTER — Telehealth: Payer: 59 | Admitting: Family

## 2018-06-07 DIAGNOSIS — J019 Acute sinusitis, unspecified: Secondary | ICD-10-CM

## 2018-06-07 MED ORDER — AMOXICILLIN-POT CLAVULANATE 875-125 MG PO TABS: 1.0000 | ORAL_TABLET | Freq: Two times a day (BID) | ORAL | 0 refills | Status: DC

## 2018-06-07 NOTE — Progress Notes (Signed)

## 2018-06-21 IMAGING — MG 2D DIGITAL SCREENING BILATERAL MAMMOGRAM WITH IMPLANTS, CAD AND
8 of 16 series · 8 of 32 positions shown · non-contrast
Comparison: Previous exam(s).

CLINICAL DATA: Screening.

EXAM:
2D DIGITAL SCREENING BILATERAL MAMMOGRAM WITH IMPLANTS, CAD AND
ADJUNCT TOMO
The patient has retropectoral implants. Standard and implant
displaced views were performed.

[L CC]
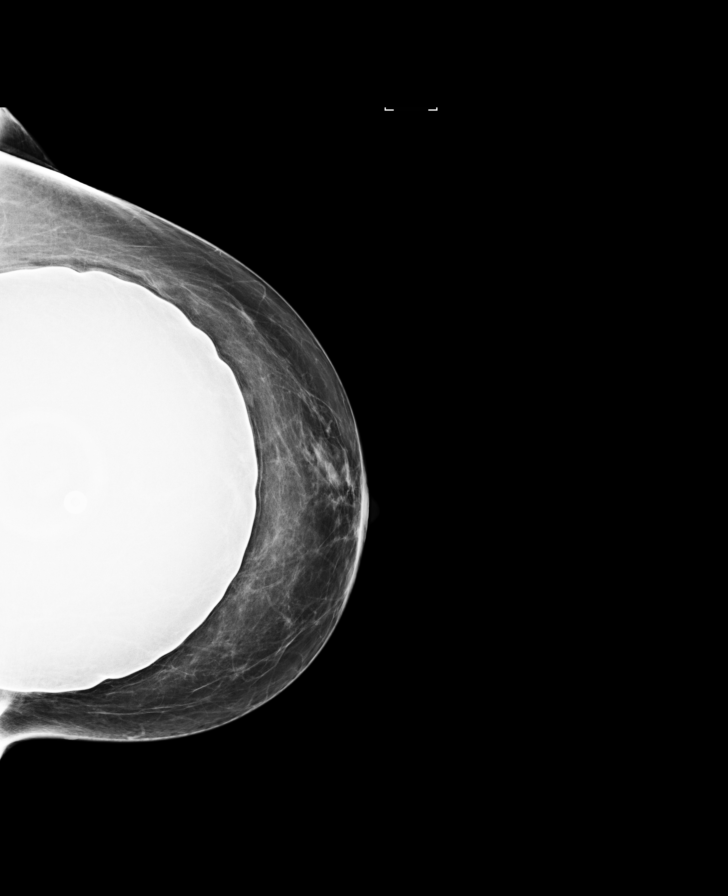

[L MLO (1 of 2)]
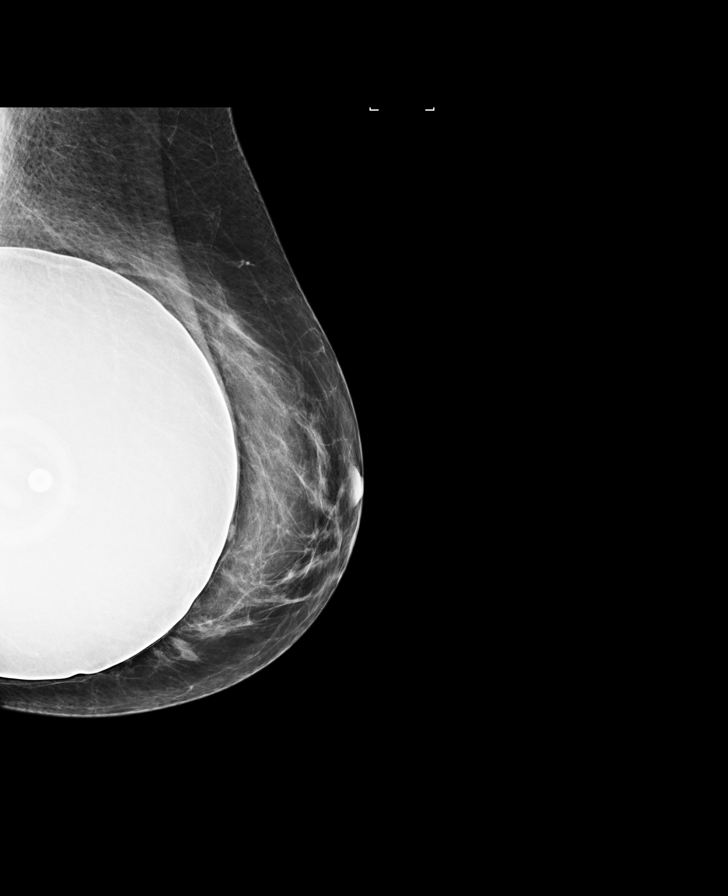

[R CC]
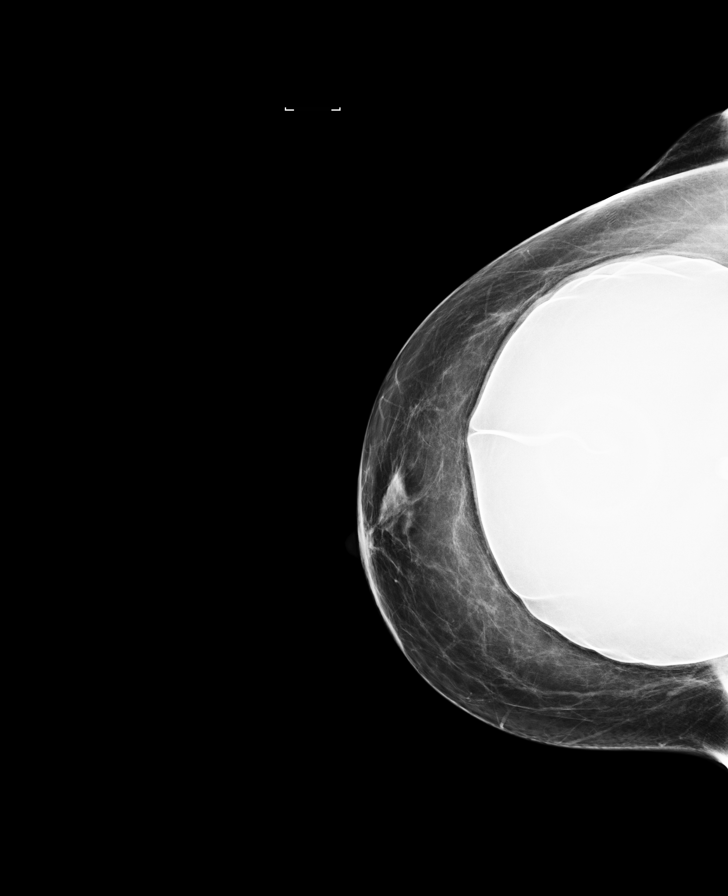

[R MLO]
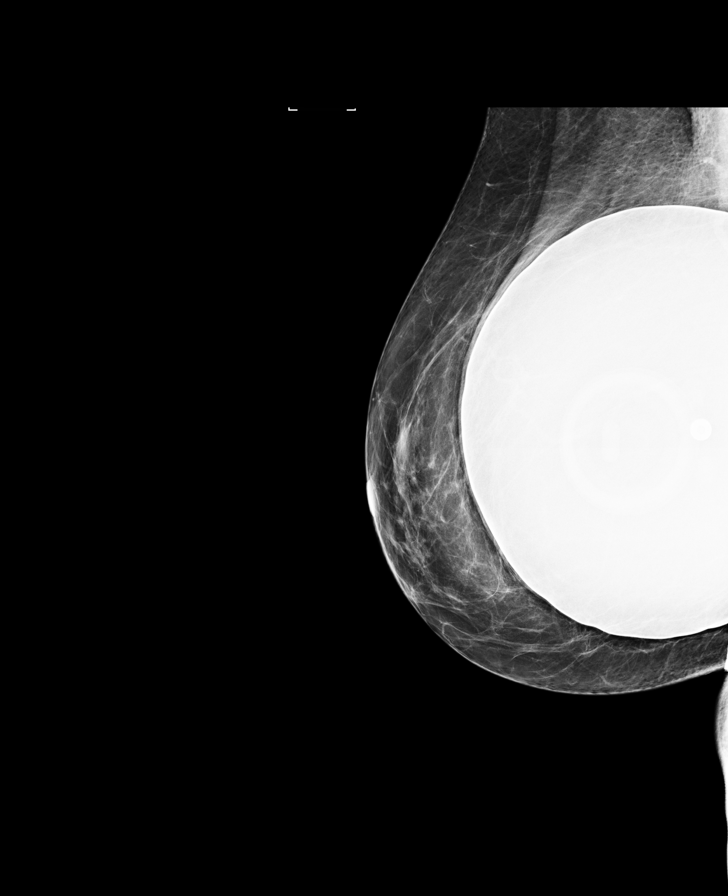

[L MLO synth-2D]
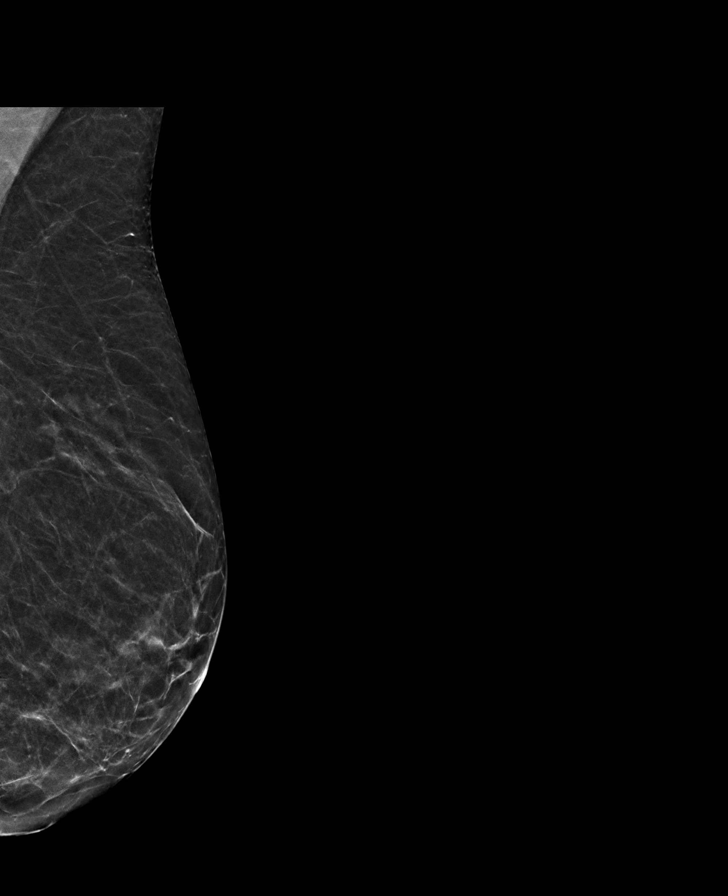

[L CC synth-2D]
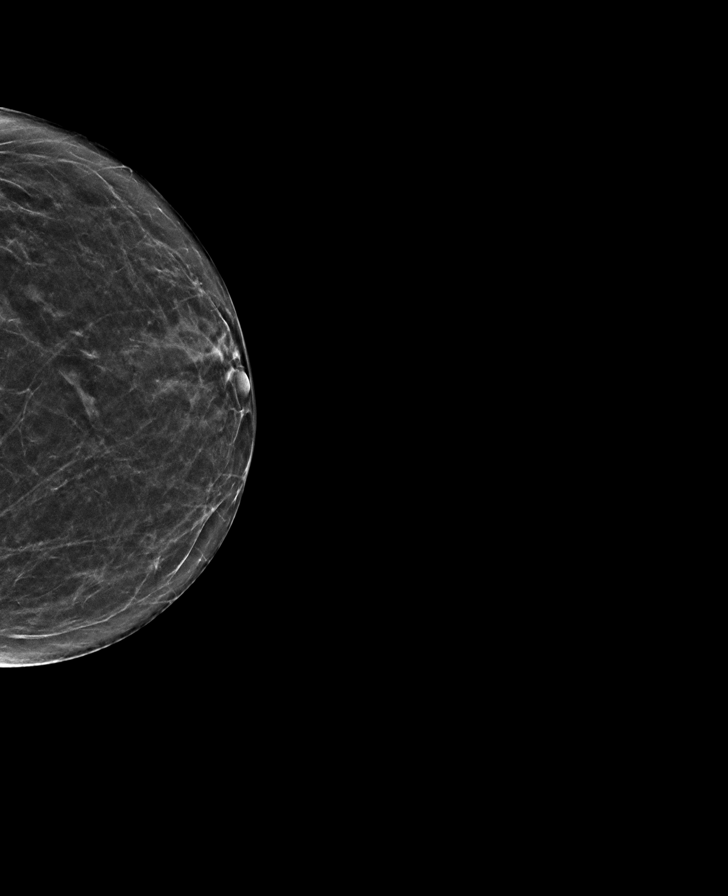

[R CC synth-2D]
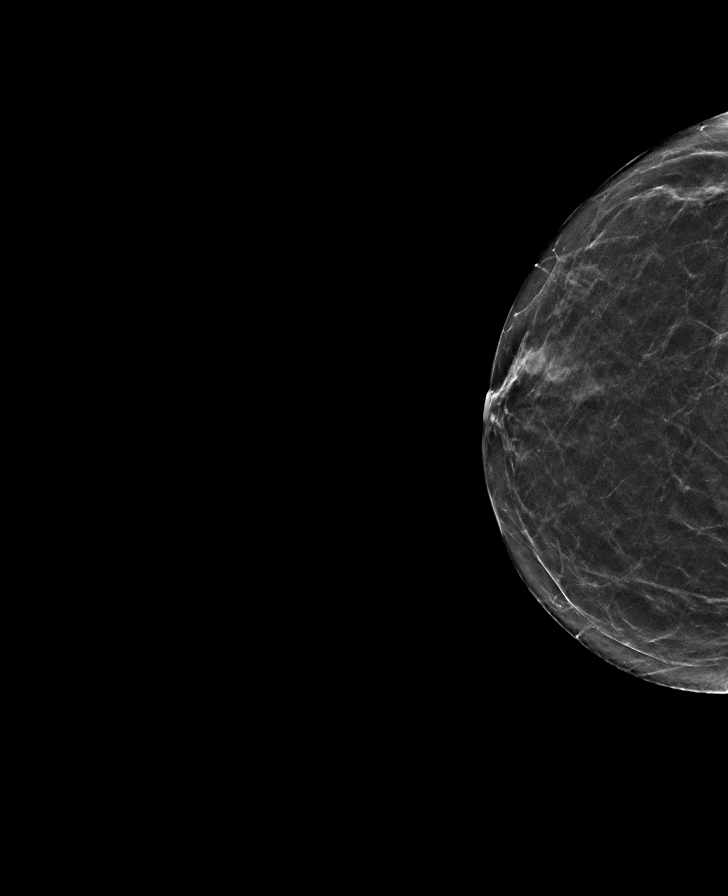

[L MLO (2 of 2)]
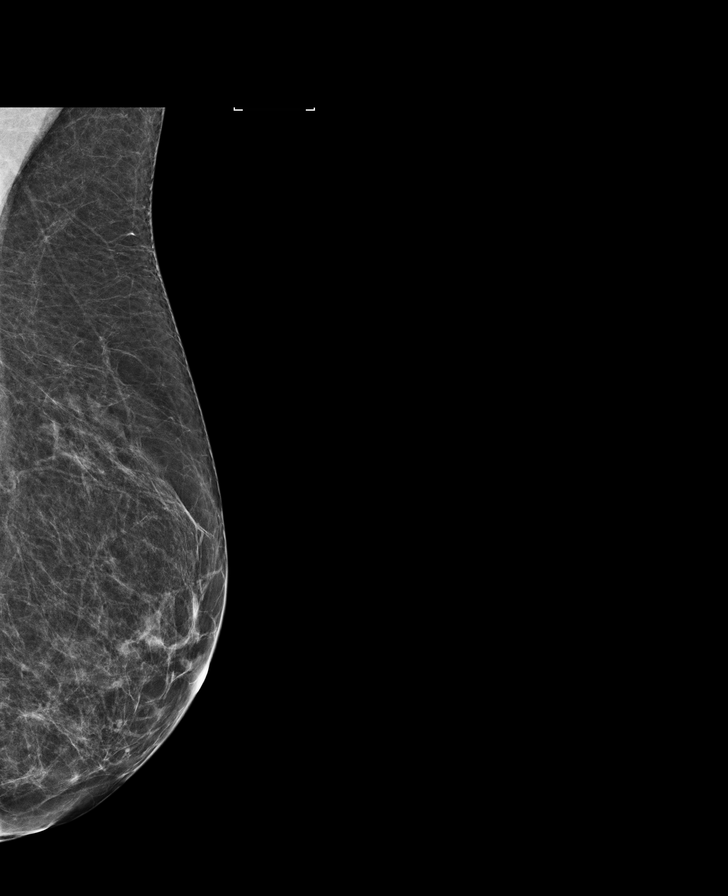

[8 of 32 positions shown; findings below may reference images not displayed]

ACR Breast Density Category b: There are scattered areas of
fibroglandular density.
FINDINGS: There are no findings suspicious for malignancy. Images were
processed with CAD.
IMPRESSION: No mammographic evidence of malignancy. A result letter of this
screening mammogram will be mailed directly to the patient.

RECOMMENDATION:
Screening mammogram in one year. (Code:[id number]-DBW)

BI-RADS CATEGORY  1:  Negative.

## 2018-08-13 ENCOUNTER — Ambulatory Visit: Payer: 59 | Admitting: Neurology

## 2018-09-28 NOTE — Progress Notes (Signed)
Follow-up Visit   Date: 09/29/18   Alicia Summers MRN: [id number] DOB: 1955/09/02   Interim History: Chistina Summers is a 63 y.o. right-handed Caucasian female with hypertension, hyperlipidemia, RLS, and glaucoma returning to the clinic for follow-up of RLS and muscle cramps.  The patient was accompanied to the clinic by self.   History of present illness: Initial visit April for muscle cramps and RLS.  She has had restless leg syndrome for about 10 years, described as the urge to constant want to move the legs, with creepy crawling sensation.  She often wakes up at night to walk around which relieves her discomfort.  She has never taken any medications for this.  She has identified that alcohol and caffeine can trigger her symptoms.  For the past year, she has muscle cramps and twitches which occur several times per week.  She has painful cramps involving the feet, lower legs, and thighs.  Prolonged sitting and alcohol may trigger these.  She tries to stay well-hydrated and engages in yoga exercises, hoping that stretching would help.  She also complains of low back pain, which is worse on the right.  Also over the same time, she has mild numbness and tingling of the toes.  She has tried acupuncture which has helped. She stopped crestor 5mg  in December 2018, but has not appreciated any improvement.     UPDATE 09/29/2018:   She has missed her prior follow-up visit, but reports doing well.  Initially, she tried gabapentin which was discontinued due to side effects and switched to ropinirole 0.25mg .  She reports having marked improvement of RLS with ropinirole, but does not take it regularly due to concern of worsening RLS while taking it.  No new issues with muscle cramps or feet paresthesias.   Medications:  Current Outpatient Medications on File Prior to Visit  Medication Sig Dispense Refill  . estradiol (ESTRACE) 0.1 MG/GM vaginal cream Insert 2g daily vaginally for 1 week, then  reduce to 1 g daily for 2 weeks, followed by a maintenance dose of 1 g  1-2 times per week. 42.5 g 11  . fluticasone (FLONASE) 50 MCG/ACT nasal spray Place 2 sprays into both nostrils daily.     No current facility-administered medications on file prior to visit.     Allergies:  Allergies  Allergen Reactions  . Dilaudid [Hydromorphone Hcl] Itching    Review of Systems:  CONSTITUTIONAL: No fevers, chills, night sweats, or weight loss.  EYES: No visual changes or eye pain ENT: No hearing changes.  No history of nose bleeds.   RESPIRATORY: No cough, wheezing and shortness of breath.   CARDIOVASCULAR: Negative for chest pain, and palpitations.   GI: Negative for abdominal discomfort, blood in stools or black stools.  No recent change in bowel habits.   GU:  No history of incontinence.   MUSCLOSKELETAL: No history of joint pain or swelling.  No myalgias.   SKIN: Negative for lesions, rash, and itching.   ENDOCRINE: Negative for cold or heat intolerance, polydipsia or goiter.   PSYCH:  No depression or anxiety symptoms.   NEURO: As Above.   Vital Signs:  BP 110/80   Pulse 68   Temp 98.2 F (36.8 C) (Oral)   Ht 5\' 2"  (1.575 m)   Wt 142 lb (64.4 kg)   LMP 10/18/1995   SpO2 98%   BMI 25.97 kg/m    General Medical Exam:   General:  Well appearing, comfortable  Eyes/ENT: see cranial nerve  examination.   Neck:  No carotid bruits. Respiratory:  Clear to auscultation, good air entry bilaterally.   Cardiac:  Regular rate and rhythm, no murmur.   Ext:  No edema   Neurological Exam: MENTAL STATUS including orientation to time, place, person, recent and remote memory, attention span and concentration, language, and fund of knowledge is normal.  Speech is not dysarthric.  CRANIAL NERVES:  No visual field defects.  Pupils equal round and reactive to light.  Normal conjugate, extra-ocular eye movements in all directions of gaze.  No ptosis.  Face is symmetric. Palate elevates  symmetrically.  Tongue is midline.  MOTOR:  Motor strength is 5/5 in all extremities.  No atrophy, fasciculations or abnormal movements.  No pronator drift.  Tone is normal.    MSRs:  Reflexes are 2+/4 throughout .  SENSORY:  Intact to vibration throughout.  COORDINATION/GAIT:   Gait narrow based and stable.   Data: Lab Results  Component Value Date   TSH 2.05 06/29/2017   Lab Results  Component Value Date   CKTOTAL 103 10/26/2017   Lab Results  Component Value Date   VITAMINB12 334 10/26/2017    IMPRESSION/PLAN: 1.  Restless leg syndrome, well-controlled on ropinirole 0.25mg  which I have encouraged her to take nightly. Refills provided for 1 year.  I discussed side effects of dopamine agonists, including sleep attacks, obessive compulsive behavior, and augmentation, which occurs at higher dosages and when individuals have been on the medication for years.  Reassurance provided.   2.  Right sciatica, intermittent.  Formal PT declined.  She prefers to do her own home exercises.    Return to clinic as needed   Thank you for allowing me to participate in patient's care.  If I can answer any additional questions, I would be pleased to do so.    Sincerely,    Dorrien Grunder K. Posey Pronto, DO

## 2018-09-29 ENCOUNTER — Other Ambulatory Visit: Payer: Self-pay

## 2018-09-29 ENCOUNTER — Encounter: Payer: Self-pay | Admitting: Neurology

## 2018-09-29 ENCOUNTER — Ambulatory Visit: Payer: 59 | Admitting: Neurology

## 2018-09-29 VITALS — BP 110/80 | HR 68 | Temp 98.2°F | Ht 62.0 in | Wt 142.0 lb

## 2018-09-29 DIAGNOSIS — M79604 Pain in right leg: Secondary | ICD-10-CM | POA: Diagnosis not present

## 2018-09-29 DIAGNOSIS — G2581 Restless legs syndrome: Secondary | ICD-10-CM | POA: Diagnosis not present

## 2018-09-29 MED ORDER — ROPINIROLE HCL 0.25 MG PO TABS
ORAL_TABLET | ORAL | 11 refills | Status: DC
Start: 2018-09-29 — End: 2018-12-29

## 2018-10-08 ENCOUNTER — Ambulatory Visit: Payer: 59 | Admitting: Neurology

## 2018-10-15 ENCOUNTER — Telehealth: Payer: 59 | Admitting: Nurse Practitioner

## 2018-10-15 DIAGNOSIS — R52 Pain, unspecified: Secondary | ICD-10-CM

## 2018-10-15 DIAGNOSIS — R0602 Shortness of breath: Secondary | ICD-10-CM

## 2018-10-15 NOTE — Progress Notes (Signed)
E-Visit for Corona Virus Screening  Based on your current symptoms, it seems unlikely that your symptoms are related to the Coronavirus.   Coronavirus disease 2019 (COVID-19) is a respiratory illness that can spread from person to person. The virus that causes COVID-19 is a new virus that was first identified in the country of [place] but is now found in multiple other countries and has spread to the [place].  Symptoms associated with the virus are mild to severe fever, cough, and shortness of breath. There is currently no vaccine to protect against COVID-19, and there is no specific antiviral treatment for the virus.   To be considered HIGH RISK for Coronavirus (COVID-19), you have to meet the following criteria:  . Traveled to [place], [place], [place], [place] or [place]; or in the [place] to [place], [place], [place], or [place]; and have fever, cough, and shortness of breath within the last 2 weeks of travel OR  . Been in close contact with a person diagnosed with COVID-19 within the last 2 weeks and have fever, cough, and shortness of breath  . IF YOU DO NOT MEET THESE CRITERIA, YOU ARE CONSIDERED LOW RISK FOR COVID-19.   It is vitally important that if you feel that you have an infection such as this virus or any other virus that you stay home and away from places where you may spread it to others.  You should self-quarantine for 14 days if you have symptoms that could potentially be coronavirus and avoid contact with people age 65 and older.   You can use medication such as delsym or mucinex OTC for cough  You may also take acetaminophen (Tylenol) as needed for fever.   Reduce your risk of any infection by using the same precautions used for avoiding the common cold or flu:  . Wash your hands often with soap and warm water for at least 20 seconds.  If soap and water are not readily available, use an alcohol-based hand sanitizer with at least 60% alcohol.  . If coughing or  sneezing, cover your mouth and nose by coughing or sneezing into the elbow areas of your shirt or coat, into a tissue or into your sleeve (not your hands). . Avoid shaking hands with others and consider head nods or verbal greetings only. . Avoid touching your eyes, nose, or mouth with unwashed hands.  . Avoid close contact with people who are sick. . Avoid places or events with large numbers of people in one location, like concerts or sporting events. . Carefully consider travel plans you have or are making. . If you are planning any travel outside or inside the [place], visit the CDC's Travelers' Health webpage for the latest health notices. . If you have some symptoms but not all symptoms, continue to monitor at home and seek medical attention if your symptoms worsen. . If you are having a medical emergency, call 911.  HOME CARE . Only take medications as instructed by your medical team. . Drink plenty of fluids and get plenty of rest. . A steam or ultrasonic humidifier can help if you have congestion.   GET HELP RIGHT AWAY IF: . You develop worsening fever. . You become short of breath . You cough up blood. . Your symptoms become more severe MAKE SURE YOU   Understand these instructions.  Will watch your condition.  Will get help right away if you are not doing well or get worse.  Your e-visit answers   were reviewed by a board certified advanced clinical practitioner to complete your personal care plan.  Depending on the condition, your plan could have included both over the counter or prescription medications.  If there is a problem please reply once you have received a response from your provider. Your safety is important to us.  If you have drug allergies check your prescription carefully.    You can use MyChart to ask questions about today's visit, request a non-urgent call back, or ask for a work or school excuse for 24 hours related to this e-Visit. If it has been greater than 24  hours you will need to follow up with your provider, or enter a new e-Visit to address those concerns. You will get an e-mail in the next two days asking about your experience.  I hope that your e-visit has been valuable and will speed your recovery. Thank you for using e-visits.   5 minutes spent reviewing and documenting in chart.  

## 2018-11-01 ENCOUNTER — Telehealth: Payer: 59 | Admitting: Physician Assistant

## 2018-11-01 ENCOUNTER — Ambulatory Visit (INDEPENDENT_AMBULATORY_CARE_PROVIDER_SITE_OTHER): Payer: 59 | Admitting: Family Medicine

## 2018-11-01 ENCOUNTER — Other Ambulatory Visit: Payer: Self-pay

## 2018-11-01 ENCOUNTER — Encounter: Payer: Self-pay | Admitting: Physician Assistant

## 2018-11-01 ENCOUNTER — Encounter: Payer: Self-pay | Admitting: Family Medicine

## 2018-11-01 DIAGNOSIS — K219 Gastro-esophageal reflux disease without esophagitis: Secondary | ICD-10-CM | POA: Diagnosis not present

## 2018-11-01 DIAGNOSIS — R0602 Shortness of breath: Secondary | ICD-10-CM

## 2018-11-01 DIAGNOSIS — R12 Heartburn: Secondary | ICD-10-CM

## 2018-11-01 MED ORDER — PANTOPRAZOLE SODIUM 40 MG PO TBEC
DELAYED_RELEASE_TABLET | ORAL | 1 refills | Status: DC
Start: 2018-11-01 — End: 2018-11-15

## 2018-11-01 NOTE — Progress Notes (Signed)
Based on what you shared with me, I feel your condition warrants further evaluation and I recommend that you be seen for a face to face office visit.   As per our conversation,  You have heartburn that is severe and is not responding to the medicines you have mentioned, including Omeprazole 40mg . Also, you have indicated that you have shortness of breath that is new with this heartburn. It is recommended you have a face to face evaluation for further workup including consideration of cardiac nature of the symptoms. Any worsening, go to the ER    NOTE: If you entered your credit card information for this eVisit, you will not be charged. You may see a "hold" on your card for the $35 but that hold will drop off and you will not have a charge processed.  If you are having a true medical emergency please call 911.  If you need an urgent face to face visit, [place] has four urgent care centers for your convenience.    PLEASE NOTE: THE [facility] LOCATIONS AND URGENT CARE CLINICS DO NOT HAVE THE TESTING FOR CORONAVIRUS COVID19 AVAILABLE.  IF YOU FEEL YOU NEED THIS TEST YOU MUST GO TO A TRIAGE LOCATION AT [facility]   [email or web address] to reserve your spot online an avoid wait times  [facility] [address] [place], [place] [address] Modified hours of operation: Monday-Friday, 10 AM to 6 PM  Saturday & Sunday 10 AM to 4 PM *Across the street from [place] (New Address!) [address], [place], [place] [address] *Just off [address], across the road from [place] hours of operation: Monday-Friday, 10 AM to 5 PM  Closed Saturday & Sunday   The following sites will take your insurance:  . [facility] [place] a Provider at this Location  [address] [place], [place] [address] . 10 am to 8 pm  Monday-Friday . 12 pm to 8 pm Saturday-Sunday   . [facility] at [place] a Provider at this Location  [address] [place], [place], [place] [address] . 8 am to 8 pm Monday-Friday . 9 am to 6 pm Saturday . 11 am to 6 pm Sunday   . [facility] at [place] Get Driving Directions  [address] [place], [place] [address] . 8 am to 8 pm Monday-Friday . 8 am to 4 pm Saturday-Sunday   Your e-visit answers were reviewed by a board certified advanced clinical practitioner to complete your personal care plan.  Thank you for using e-Visits.  I have spent 7 min in completion and review of this note- Lacy Duverney [facility]

## 2018-11-01 NOTE — Progress Notes (Signed)
Chief Complaint  Patient presents with  . Shortness of Breath    GERD Alicia Summers is a 63 y.o. female who presents for evaluation of reflux. Due to outbreak, we are interacting via web portal for an electronic face-to-face visit. I verified patient's ID using 2 identifiers.   Pt w remote hx of reflux. Feels burning, pressure, dry cough, sob over past mo. Had evisit at beginning of mo due to COVID-19 concern. No recent travel or known exposure. Denies fevers, URI s/s's outside of allergies, bleeding, unintentional wt loss or nighttime awakenings. Has tried Pepcid, omeprazole (7 d), lansoprazole (day 2 today) and Tums with little relief. Of note, her previous episode of reflux was accompanied by sob. Denies wheezing, swelling.   Past Medical History:  Diagnosis Date  . Acute bacterial sinusitis    dx 01-22-2015  . Arthritis   . Borderline glaucoma    bilatera eyes  . Constipation 06/17/2015  . History of colon polyps   . Hydronephrosis, right   . Hyperlipidemia   . Migraine   . Preventative health care 06/15/2015  . Right flank pain    intermittant  . Wears glasses     Review of Systems Gastrointestinal: as noted in HPI  Exam No conversational dyspnea Age appropriate judgment and insight Nml affect and mood  Assessment and Plan  Gastroesophageal reflux disease, esophagitis presence not specified - Plan: pantoprazole (PROTONIX) 40 MG tablet  High dose PPI for 1 week then daily. It sounds more like reflux after speaking with pt. Activity as tolerated. I will have her scheduled with Dr. Charlett Blake in person in 2 weeks if no better. If she is better, will plan to d/c PPI after 60 d.  Pt voiced understanding and agreement to the plan.  [place], DO 11/01/18  11:35 AM

## 2018-11-15 ENCOUNTER — Ambulatory Visit (INDEPENDENT_AMBULATORY_CARE_PROVIDER_SITE_OTHER): Payer: 59 | Admitting: Family Medicine

## 2018-11-15 ENCOUNTER — Other Ambulatory Visit: Payer: Self-pay

## 2018-11-15 ENCOUNTER — Encounter: Payer: Self-pay | Admitting: Family Medicine

## 2018-11-15 DIAGNOSIS — E785 Hyperlipidemia, unspecified: Secondary | ICD-10-CM

## 2018-11-15 DIAGNOSIS — R1013 Epigastric pain: Secondary | ICD-10-CM

## 2018-11-15 DIAGNOSIS — K219 Gastro-esophageal reflux disease without esophagitis: Secondary | ICD-10-CM | POA: Diagnosis not present

## 2018-11-15 DIAGNOSIS — K5900 Constipation, unspecified: Secondary | ICD-10-CM

## 2018-11-15 DIAGNOSIS — B349 Viral infection, unspecified: Secondary | ICD-10-CM

## 2018-11-15 MED ORDER — PANTOPRAZOLE SODIUM 40 MG PO TBEC: 40.0000 mg | DELAYED_RELEASE_TABLET | Freq: Every day | ORAL | 3 refills | Status: DC

## 2018-11-15 MED ORDER — FAMOTIDINE 40 MG PO TABS: 40.0000 mg | ORAL_TABLET | Freq: Every day | ORAL | 3 refills | Status: DC

## 2018-11-15 NOTE — Patient Instructions (Signed)
Encouraged increased hydration and fiber in diet. Daily probiotics. If bowels not moving can use MOM 2 tbls po in 4 oz of warm prune juice by mouth every 2-3 days. If no results then repeat in 4 hours with  Dulcolax suppository pr, may repeat again in 4 more hours as needed. Seek care if symptoms worsen. Consider daily Miralax and/or Dulcolax if symptoms persist.   Mix Miralax and Benefiber once to twice daily

## 2018-11-15 NOTE — Progress Notes (Signed)
Questions about Protonix  No longer taking Crestor

## 2018-11-16 ENCOUNTER — Telehealth: Payer: Self-pay

## 2018-11-16 NOTE — Telephone Encounter (Signed)
Waiting new insurance cards- mychart message sent.

## 2018-11-17 DIAGNOSIS — B349 Viral infection, unspecified: Secondary | ICD-10-CM | POA: Insufficient documentation

## 2018-11-17 NOTE — Progress Notes (Signed)
Virtual Visit via Video Note  I connected with Alicia Summers on 11/17/18 at  9:20 AM EDT by a video enabled telemedicine application and verified that I am speaking with the correct person using two identifiers.  Location: Patient: home Provider: office   I discussed the limitations of evaluation and management by telemedicine and the availability of in person appointments. The patient expressed understanding and agreed to proceed. Magdalene Molly, CMA was able to get the patient set up on a video visit    Subjective:    Patient ID: Alicia Summers, female    DOB: 10/23/1955, 63 y.o.   MRN: [id number]  No chief complaint on file.   HPI Patient is in today for evaluation of dyspepsia and discomfort in her epigastrium. She notes a sense of swelling and bloating in the region. Her bowels have been slightly slow. No bloody or tarry stool. No anorexia or nausea or vomiting. No fevers or chills. No recent hospitalizations but she did have an evisit last month.  Last month she had an acute repiratory illness for which she had an evisit with SOB, myalgias, fatigue and headache. Those symptoms have all resolved. Denies CP/palp/SOB/HA/congestion/fevers or GU c/o. Taking meds as prescribed Past Medical History:  Diagnosis Date  . Acute bacterial sinusitis    dx 01-22-2015  . Arthritis   . Borderline glaucoma    bilatera eyes  . Constipation 06/17/2015  . History of colon polyps   . Hydronephrosis, right   . Hyperlipidemia   . Migraine   . Preventative health care 06/15/2015  . Right flank pain    intermittant  . Wears glasses     Past Surgical History:  Procedure Laterality Date  . ABDOMINAL HYSTERECTOMY   1997   w/ Bladder suspension  . AUGMENTATION MAMMAPLASTY Bilateral 2003  . BREAST ENHANCEMENT SURGERY Bilateral 2003  . COLONOSCOPY  03-04-2007  . CYSTOSCOPY WITH RETROGRADE PYELOGRAM, URETEROSCOPY AND STENT PLACEMENT Right 01/24/2015   Procedure: CYSTOSCOPY WITH RIGHT  RETROGRADE PYELOGRAM, DIAGNOSTIC URETEROSCOPY ;  Surgeon: Cleon Gustin, MD;  Location: [facility];  Service: Urology;  Laterality: Right;  [place] [id number]  . TUBAL LIGATION      Family History  Problem Relation Age of Onset  . COPD Mother   . Prostate cancer Father   . Colon cancer Maternal Grandmother   . Colon cancer Paternal Grandfather   . Alcohol abuse Sister   . Cirrhosis Sister   . Breast cancer Neg Hx     Social History   Socioeconomic History  . Marital status: Married    Spouse name: Not on file  . Number of children: 2  . Years of education: Not on file  . Highest education level: Some college, no degree  Occupational History  . Occupation: retired  Scientific laboratory technician  . Financial resource strain: Not on file  . Food insecurity:    Worry: Not on file    Inability: Not on file  . Transportation needs:    Medical: Not on file    Non-medical: Not on file  Tobacco Use  . Smoking status: Never Smoker  . Smokeless tobacco: Never Used  Substance and Sexual Activity  . Alcohol use: Yes    Alcohol/week: 2.0 standard drinks    Types: 2 Glasses of wine per week  . Drug use: No  . 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 avoid dairy  Lifestyle  . Physical activity:    Days per week: Not on file    Minutes per session: Not on file  . Stress: Not on file  Relationships  . Social connections:    Talks on phone: Not on file    Gets together: Not on file    Attends religious service: Not on file    Active member of club or organization: Not on file    Attends meetings of clubs or organizations: Not on file    Relationship status: Not on file  . Intimate partner violence:    Fear of current or ex partner: Not on file    Emotionally abused: Not on file    Physically abused: Not on file    Forced sexual activity: Not on file  Other Topics Concern   . Not on file  Social History Narrative   Pt lives in 3 story home with her husband   Has 2 adult children   Some college   retired    Outpatient Medications Prior to Visit  Medication Sig Dispense Refill  . estradiol (ESTRACE) 0.1 MG/GM vaginal cream Insert 2g daily vaginally for 1 week, then reduce to 1 g daily for 2 weeks, followed by a maintenance dose of 1 g  1-2 times per week. 42.5 g 11  . fluticasone (FLONASE) 50 MCG/ACT nasal spray Place 2 sprays into both nostrils daily.    Marland Kitchen rOPINIRole (REQUIP) 0.25 MG tablet Take 1 tablet 3 hours before bedtime. 30 tablet 11  . pantoprazole (PROTONIX) 40 MG tablet Take 1 tab twice daily for 2 weeks and then 1 tab daily. 30 tablet 1   No facility-administered medications prior to visit.     Allergies  Allergen Reactions  . Dilaudid [Hydromorphone Hcl] Itching    Review of Systems  Constitutional: Positive for malaise/fatigue. Negative for fever.  HENT: Negative for congestion.   Eyes: Negative for blurred vision.  Respiratory: Negative for shortness of breath.   Cardiovascular: Negative for chest pain, palpitations and leg swelling.  Gastrointestinal: Positive for abdominal pain and heartburn. Negative for blood in stool and nausea.  Genitourinary: Negative for dysuria and frequency.  Musculoskeletal: Negative for falls.  Skin: Negative for rash.  Neurological: Negative for dizziness, loss of consciousness and headaches.  Endo/Heme/Allergies: Negative for environmental allergies.  Psychiatric/Behavioral: Negative for depression. The patient is not nervous/anxious.        Objective:    Physical Exam Constitutional:      Appearance: Normal appearance. She is not ill-appearing.  HENT:     Head: Normocephalic and atraumatic.     Nose: Nose normal.  Pulmonary:     Effort: Pulmonary effort is normal.  Neurological:     Mental Status: She is alert and oriented to person, place, and time.  Psychiatric:        Mood and Affect:  Mood normal.        Behavior: Behavior normal.     Wt 136 lb (61.7 kg)   LMP 10/18/1995   BMI 24.87 kg/m  Wt Readings from Last 3 Encounters:  11/15/18 136 lb (61.7 kg)  09/29/18 142 lb (64.4 kg)  02/10/18 139 lb 4 oz (63.2 kg)    Diabetic Foot Exam - Simple   No data filed     Lab Results  Component Value Date   WBC 4.6 06/29/2017   HGB 13.0 06/29/2017   HCT 38.8 06/29/2017   PLT 259.0 06/29/2017   GLUCOSE 92 06/29/2017   CHOL  151 06/29/2017   TRIG 63.0 06/29/2017   HDL 66.90 06/29/2017   LDLCALC 72 06/29/2017   ALT 14 06/29/2017   AST 18 06/29/2017   NA 140 06/29/2017   K 4.1 06/29/2017   CL 105 06/29/2017   CREATININE 0.78 06/29/2017   BUN 17 06/29/2017   CO2 29 06/29/2017   TSH 2.05 06/29/2017    Lab Results  Component Value Date   TSH 2.05 06/29/2017   Lab Results  Component Value Date   WBC 4.6 06/29/2017   HGB 13.0 06/29/2017   HCT 38.8 06/29/2017   MCV 93.3 06/29/2017   PLT 259.0 06/29/2017   Lab Results  Component Value Date   NA 140 06/29/2017   K 4.1 06/29/2017   CO2 29 06/29/2017   GLUCOSE 92 06/29/2017   BUN 17 06/29/2017   CREATININE 0.78 06/29/2017   BILITOT 0.5 06/29/2017   ALKPHOS 53 06/29/2017   AST 18 06/29/2017   ALT 14 06/29/2017   PROT 6.9 06/29/2017   ALBUMIN 4.2 06/29/2017   CALCIUM 9.2 06/29/2017   GFR 79.40 06/29/2017   Lab Results  Component Value Date   CHOL 151 06/29/2017   Lab Results  Component Value Date   HDL 66.90 06/29/2017   Lab Results  Component Value Date   LDLCALC 72 06/29/2017   Lab Results  Component Value Date   TRIG 63.0 06/29/2017   Lab Results  Component Value Date   CHOLHDL 2 06/29/2017   No results found for: HGBA1C     Assessment & Plan:   Problem List Items Addressed This Visit    Hyperlipidemia    Encouraged heart healthy diet, increase exercise, avoid trans fats, consider a krill oil cap daily. Patient no longer taking Crestor      Dyspepsia    Continues  onPantoprazole but notes increased sense of pressure and discomfort mostly in her epigastrium. Discussed her coming in for bloodwork including H Pylori but she prefers to start by adding Famotidine 40 mg qhs and Avoid offending foods, start probiotics. Do not eat large meals in late evening and consider raising head of bed. If no improvement let us know and we can consider testing and possible referral      Constipation    Encouraged increased hydration and fiber in diet. Daily probiotics. If bowels not moving can use MOM 2 tbls po in 4 oz of warm prune juice by mouth every 2-3 days. If no results then repeat in 4 hours with  Dulcolax suppository pr, may repeat again in 4 more hours as needed. Seek care if symptoms worsen. Consider daily Miralax and/or Dulcolax if symptoms persist. Miralax with Benefiber once to twice daily      Viral illness    Last month she had an acute repiratory illness for which she had an evisit with SOB, myalgias, fatigue and headache. Those symptoms have all resolved       Other Visit Diagnoses    Gastroesophageal reflux disease, esophagitis presence not specified       Relevant Medications   pantoprazole (PROTONIX) 40 MG tablet   famotidine (PEPCID) 40 MG tablet      I have changed Alicia Summers's pantoprazole. I am also having her start on famotidine. Additionally, I am having her maintain her fluticasone, estradiol, and rOPINIRole.  Meds ordered this encounter  Medications  . pantoprazole (PROTONIX) 40 MG tablet    Sig: Take 1 tablet (40 mg total) by mouth daily. Take 1 tab twice daily for  2 weeks and then 1 tab daily.    Dispense:  30 tablet    Refill:  3  . famotidine (PEPCID) 40 MG tablet    Sig: Take 1 tablet (40 mg total) by mouth at bedtime.    Dispense:  30 tablet    Refill:  3    I discussed the assessment and treatment plan with the patient. The patient was provided an opportunity to ask questions and all were answered. The patient agreed with  the plan and demonstrated an understanding of the instructions.   The patient was advised to call back or seek an in-person evaluation if the symptoms worsen or if the condition fails to improve as anticipated.  I provided 25 minutes of non-face-to-face time during this encounter.   Penni Homans, MD

## 2018-11-17 NOTE — Assessment & Plan Note (Signed)
Continues onPantoprazole but notes increased sense of pressure and discomfort mostly in her epigastrium. Discussed her coming in for bloodwork including H Pylori but she prefers to start by adding Famotidine 40 mg qhs and Avoid offending foods, start probiotics. Do not eat large meals in late evening and consider raising head of bed. If no improvement let us know and we can consider testing and possible referral

## 2018-11-17 NOTE — Assessment & Plan Note (Signed)
Last month she had an acute repiratory illness for which she had an evisit with SOB, myalgias, fatigue and headache. Those symptoms have all resolved

## 2018-11-17 NOTE — Assessment & Plan Note (Signed)
Encouraged heart healthy diet, increase exercise, avoid trans fats, consider a krill oil cap daily. Patient no longer taking Crestor

## 2018-11-17 NOTE — Assessment & Plan Note (Signed)
Encouraged increased hydration and fiber in diet. Daily probiotics. If bowels not moving can use MOM 2 tbls po in 4 oz of warm prune juice by mouth every 2-3 days. If no results then repeat in 4 hours with  Dulcolax suppository pr, may repeat again in 4 more hours as needed. Seek care if symptoms worsen. Consider daily Miralax and/or Dulcolax if symptoms persist. Miralax with Benefiber once to twice daily

## 2018-11-22 ENCOUNTER — Encounter: Payer: Self-pay | Admitting: Family Medicine

## 2018-11-22 NOTE — Telephone Encounter (Signed)
PA initiated via Covermymeds; KEY: [id number]. Awaiting determination.

## 2018-11-22 NOTE — Telephone Encounter (Signed)
PA approved.   Request Reference Number: [id number]. FAMOTIDINE TAB 40MG  is approved through 11/22/2019. For further questions, call [phone]

## 2018-12-01 ENCOUNTER — Encounter: Payer: Self-pay | Admitting: Family Medicine

## 2018-12-02 ENCOUNTER — Other Ambulatory Visit: Payer: 59

## 2018-12-02 ENCOUNTER — Telehealth: Payer: Self-pay | Admitting: Family Medicine

## 2018-12-02 ENCOUNTER — Other Ambulatory Visit: Payer: Self-pay | Admitting: Family Medicine

## 2018-12-02 DIAGNOSIS — Z20822 Contact with and (suspected) exposure to covid-19: Secondary | ICD-10-CM

## 2018-12-02 DIAGNOSIS — R197 Diarrhea, unspecified: Secondary | ICD-10-CM

## 2018-12-02 DIAGNOSIS — R109 Unspecified abdominal pain: Secondary | ICD-10-CM

## 2018-12-02 NOTE — Telephone Encounter (Signed)
Received call from PCP  To order Covid  Testing.  Telephone call to Patient  To schedule appointment for lab.  Patient  Voiced understanding.

## 2018-12-02 NOTE — Telephone Encounter (Signed)
Spoke with patient she is ok with the COVID testing and also the GI referral

## 2018-12-05 LAB — NOVEL CORONAVIRUS, NAA: SARS-CoV-2, NAA: NOT DETECTED

## 2018-12-07 ENCOUNTER — Other Ambulatory Visit: Payer: Self-pay

## 2018-12-07 ENCOUNTER — Ambulatory Visit (HOSPITAL_BASED_OUTPATIENT_CLINIC_OR_DEPARTMENT_OTHER)
Admission: RE | Admit: 2018-12-07 | Discharge: 2018-12-07 | Disposition: A | Discharge status: Home / Self Care | Payer: 59 | Source: Ambulatory Visit | Attending: Family Medicine | Admitting: Family Medicine

## 2018-12-07 ENCOUNTER — Other Ambulatory Visit (HOSPITAL_BASED_OUTPATIENT_CLINIC_OR_DEPARTMENT_OTHER): Payer: Self-pay | Admitting: Family Medicine

## 2018-12-07 DIAGNOSIS — R197 Diarrhea, unspecified: Secondary | ICD-10-CM

## 2018-12-07 DIAGNOSIS — Z1231 Encounter for screening mammogram for malignant neoplasm of breast: Secondary | ICD-10-CM

## 2018-12-07 DIAGNOSIS — R109 Unspecified abdominal pain: Secondary | ICD-10-CM | POA: Diagnosis present

## 2018-12-14 ENCOUNTER — Ambulatory Visit: Payer: 59 | Admitting: Family Medicine

## 2018-12-29 ENCOUNTER — Ambulatory Visit: Payer: 59 | Admitting: Physician Assistant

## 2018-12-29 ENCOUNTER — Telehealth: Payer: Self-pay

## 2018-12-29 ENCOUNTER — Other Ambulatory Visit (INDEPENDENT_AMBULATORY_CARE_PROVIDER_SITE_OTHER): Payer: 59

## 2018-12-29 ENCOUNTER — Encounter: Payer: Self-pay | Admitting: Physician Assistant

## 2018-12-29 VITALS — BP 130/90 | HR 93 | Temp 98.0°F | Ht 62.0 in | Wt 140.0 lb

## 2018-12-29 DIAGNOSIS — R1013 Epigastric pain: Secondary | ICD-10-CM | POA: Diagnosis not present

## 2018-12-29 DIAGNOSIS — N390 Urinary tract infection, site not specified: Secondary | ICD-10-CM | POA: Diagnosis not present

## 2018-12-29 DIAGNOSIS — K219 Gastro-esophageal reflux disease without esophagitis: Secondary | ICD-10-CM | POA: Diagnosis not present

## 2018-12-29 DIAGNOSIS — K2970 Gastritis, unspecified, without bleeding: Secondary | ICD-10-CM

## 2018-12-29 LAB — CBC WITH DIFFERENTIAL/PLATELET
Basophils Absolute: 0.1 10*3/uL (ref 0.0–0.1)
Basophils Relative: 1.1 % (ref 0.0–3.0)
Eosinophils Absolute: 0.1 10*3/uL (ref 0.0–0.7)
Eosinophils Relative: 1.7 % (ref 0.0–5.0)
HCT: 38.6 % (ref 36.0–46.0)
Hemoglobin: 13.2 g/dL (ref 12.0–15.0)
Lymphocytes Relative: 30.6 % (ref 12.0–46.0)
Lymphs Abs: 1.8 10*3/uL (ref 0.7–4.0)
MCHC: 34.2 g/dL (ref 30.0–36.0)
MCV: 93.3 fl (ref 78.0–100.0)
Monocytes Absolute: 0.4 10*3/uL (ref 0.1–1.0)
Monocytes Relative: 7 % (ref 3.0–12.0)
Neutro Abs: 3.4 10*3/uL (ref 1.4–7.7)
Neutrophils Relative %: 59.6 % (ref 43.0–77.0)
Platelets: 325 10*3/uL (ref 150.0–400.0)
RBC: 4.13 Mil/uL (ref 3.87–5.11)
RDW: 12.6 % (ref 11.5–15.5)
WBC: 5.7 10*3/uL (ref 4.0–10.5)

## 2018-12-29 MED ORDER — PANTOPRAZOLE SODIUM 40 MG PO TBEC: 40.0000 mg | DELAYED_RELEASE_TABLET | Freq: Every day | ORAL | 0 refills | Status: DC

## 2018-12-29 NOTE — Progress Notes (Addendum)
Subjective:    Patient ID: Alicia Summers, female    DOB: Nov 01, 1955, 63 y.o.   MRN: [id number]  HPI Ramona is a pleasant 63 year old white female, known to Dr. Fuller Plan from prior colonoscopy done in 2018 for screening.  Referred today by Dr. Charlett Blake. Colonoscopy May 2018 showed multiple diverticuli of the left colon and internal hemorrhoids.  No polyps She had an episode in April 2020 with abdominal pain/discomfort, myalgias and headache.  Was tested for COVID and negative by PCP.  Patient says she realized afterwards that she had been started on Requip and thinks that is what caused her symptoms.  She stopped it for short period of time and then restarted it and had immediate recurrence of symptoms and discontinued. She says about 10 days ago after driving to the beach she had developed some low back pain and then after returning from the beach with a long drive her back pain became severe.  [facility] and diagnosed with a urinary tract infection and muscle spasms of the lower back.  She was given a prescription for Bactrim DS, Flexeril, and diclofenac 50 mg 3 times daily.  She says she was hurting so bad in her lower back she was not thinking about the combination that she was taking and within a couple of days she began having a lot of abdominal pain.  She stopped the antibiotic and then shortly thereafter stop the other meds.  Over the past days she is feeling better but has had some lingering queasiness and gnawing discomfort in her epigastrium.  No fever or chills, she has been eating bland and in small amounts.  No vomiting she has had some heartburn off and on.  Bowel movements currently normal.  She denies any dysuria. She says she is always known that she is very sensitive to Aleve and Advil with GI symptoms and did not realize that the diclofenac was a similar medication.    Review of Systems Pertinent positive and negative review of systems were noted in the above HPI  section.  All other review of systems was otherwise negative.  Outpatient Encounter Medications as of 12/29/2018  Medication Sig  . estradiol (ESTRACE) 0.1 MG/GM vaginal cream Insert 2g daily vaginally for 1 week, then reduce to 1 g daily for 2 weeks, followed by a maintenance dose of 1 g  1-2 times per week. (Patient not taking: Reported on 12/29/2018)  . pantoprazole (PROTONIX) 40 MG tablet Take 1 tablet (40 mg total) by mouth daily. Take 1 tab twice daily for 2 weeks and then 1 tab daily for 2 weeks.  . [DISCONTINUED] famotidine (PEPCID) 40 MG tablet Take 1 tablet (40 mg total) by mouth at bedtime.  . [DISCONTINUED] fluticasone (FLONASE) 50 MCG/ACT nasal spray Place 2 sprays into both nostrils daily.  . [DISCONTINUED] pantoprazole (PROTONIX) 40 MG tablet Take 1 tablet (40 mg total) by mouth daily. Take 1 tab twice daily for 2 weeks and then 1 tab daily. (Patient not taking: Reported on 12/29/2018)  . [DISCONTINUED] rOPINIRole (REQUIP) 0.25 MG tablet Take 1 tablet 3 hours before bedtime. (Patient not taking: Reported on 12/29/2018)   No facility-administered encounter medications on file as of 12/29/2018.    Allergies  Allergen Reactions  . Dilaudid [Hydromorphone Hcl] Itching   Patient Active Problem List   Diagnosis Date Noted  . Viral illness 11/17/2018  . Prominent abdominal aortic pulse 08/28/2017  . Right leg pain 01/02/2017  . Constipation 06/17/2015  .  Acute nonsuppurative otitis media of right ear 06/17/2015  . Preventative health care 06/15/2015  . Screen for colon cancer 12/24/2014  . History of chicken pox 12/12/2014  . Hyperlipidemia 10/17/2013  . Allergic rhinitis 10/17/2013  . Endometriosis 10/17/2013  . Menopausal hot flushes 10/17/2013  . Dyspepsia 10/17/2013   Social History   Socioeconomic History  . Marital status: Married    Spouse name: Not on file  . Number of children: 2  . Years of education: Not on file  . Highest education level: Some college, no degree   Occupational History  . Occupation: retired  Scientific laboratory technician  . Financial resource strain: Not on file  . Food insecurity    Worry: Not on file    Inability: Not on file  . Transportation needs    Medical: Not on file    Non-medical: Not on file  Tobacco Use  . Smoking status: Never Smoker  . Smokeless tobacco: Never Used  Substance and Sexual Activity  . Alcohol use: Yes    Alcohol/week: 2.0 standard drinks    Types: 2 Glasses of wine per week    Comment: ocassionally  . Drug use: No  . 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 avoid dairy  Lifestyle  . Physical activity    Days per week: Not on file    Minutes per session: Not on file  . Stress: Not on file  Relationships  . Social Herbalist on phone: Not on file    Gets together: Not on file    Attends religious service: Not on file    Active member of club or organization: Not on file    Attends meetings of clubs or organizations: Not on file    Relationship status: Not on file  . Intimate partner violence    Fear of current or ex partner: Not on file    Emotionally abused: Not on file    Physically abused: Not on file    Forced sexual activity: Not on file  Other Topics Concern  . Not on file  Social History Narrative   Pt lives in 3 story home with her husband   Has 2 adult children   Some college   retired    Ms. Pacifico's family history includes Alcohol abuse in her sister; COPD in her mother; Cirrhosis in her sister; Colon cancer in her maternal grandmother and paternal grandfather; Prostate cancer in her father.      Objective:    Vitals:   12/29/18 1424  BP: 130/90  Pulse: 93  Temp: 98 F (36.7 C)    Physical Exam Well-developed well-nourished female in no acute distress. BMI 25.6  HEENT; nontraumatic normocephalic, EOMI, PE R LA, sclera anicteric. Oropharynx; not examined/wearing mass/COVID  Neck; supple, no JVD Cardiovascular; regular rate and rhythm with S1-S2, no murmur rub or gallop Pulmonary; Clear bilaterally Abdomen; soft, nontender, nondistended, no palpable mass or hepatosplenomegaly, bowel sounds are active Rectal; not done Skin; benign exam, no jaundice rash or appreciable lesions Extremities; no clubbing cyanosis or edema skin warm and dry Neuro/Psych; alert and oriented x4, grossly nonfocal mood and affect appropriate       Assessment & Plan:   #71 63 year old female with probable medication induced dyspepsia and gastritis after taking a course of antibiotic and prescription anti-inflammatory. #2 recent UTI #3 hyperlipidemia #4 diverticulosis  Plan: Patient advised to avoid all  NSAIDs. Continue bland diet with gradual advancement. Check UA with reflex culture today and CBC with differential Start Protonix 40 mg p.o. twice daily x2 weeks then decrease to 1 p.o. every morning x2 weeks and if symptoms have resolved may discontinue. She is asked to call back for advice should she have any recurrence in symptoms.   Genia Harold PA-C 12/29/2018   Cc: Mosie Lukes, MD

## 2018-12-29 NOTE — Patient Instructions (Signed)
If you are age 63 or older, your body mass index should be between 23-30. Your Body mass index is 25.61 kg/m. If this is out of the aforementioned range listed, please consider follow up with your Primary Care Provider.  If you are age 47 or younger, your body mass index should be between 19-25. Your Body mass index is 25.61 kg/m. If this is out of the aformentioned range listed, please consider follow up with your Primary Care Provider.   We have sent the following medications to your pharmacy for you to pick up at your convenience: Protonix  Your provider has requested that you go to the basement level for lab work before leaving today. Press "B" on the elevator. The lab is located at the first door on the left as you exit the elevator.  Call back if not feeling better in an week and ask to speak with Eustaquio Maize (Nurse).  We will call you with results.  Thank you for choosing me and [facility].   Amy Esterwood, PA-C

## 2018-12-29 NOTE — Telephone Encounter (Signed)
Covid-19 screening questions   Do you now or have you had a fever in the last 14 days no   Do you have any respiratory symptoms of shortness of breath or cough now or in the last 14 days no  Do you have any family members or close contacts with diagnosed or suspected Covid-19 in the past 14 days no  Have you been tested for Covid-19 and found to be positive no          

## 2018-12-30 ENCOUNTER — Encounter: Payer: Self-pay | Admitting: *Deleted

## 2018-12-30 LAB — URINALYSIS W MICROSCOPIC + REFLEX CULTURE

## 2018-12-30 LAB — NO CULTURE INDICATED

## 2018-12-30 NOTE — Addendum Note (Signed)
Addended by: Lucio Edward T on: 12/30/2018 07:46 AM   Modules accepted: Level of Service

## 2019-01-05 ENCOUNTER — Ambulatory Visit (HOSPITAL_BASED_OUTPATIENT_CLINIC_OR_DEPARTMENT_OTHER): Payer: 59

## 2019-01-11 ENCOUNTER — Ambulatory Visit (HOSPITAL_BASED_OUTPATIENT_CLINIC_OR_DEPARTMENT_OTHER): Payer: 59

## 2019-01-15 ENCOUNTER — Encounter: Payer: Self-pay | Admitting: Family Medicine

## 2019-01-15 ENCOUNTER — Telehealth: Payer: Self-pay

## 2019-01-15 ENCOUNTER — Ambulatory Visit (INDEPENDENT_AMBULATORY_CARE_PROVIDER_SITE_OTHER): Payer: 59 | Admitting: Family Medicine

## 2019-01-15 DIAGNOSIS — B349 Viral infection, unspecified: Secondary | ICD-10-CM

## 2019-01-15 DIAGNOSIS — Z20822 Contact with and (suspected) exposure to covid-19: Secondary | ICD-10-CM

## 2019-01-15 NOTE — Progress Notes (Signed)
Established Patient Office Visit  Subjective:  Patient ID: Alicia Summers, female    DOB: 1956-01-31  Age: 63 y.o. MRN: [id number]  CC:  Chief Complaint  Patient presents with  . Headache  . Fever    100.2  . Generalized Body Aches    HPI Alicia Summers presents for a 3-day history of malaise, fatigue, elevated temperature, headache, runny nose, nausea, myalgias.  Patient denies sore throat, vomiting, cough, shortness of breath or difficulty breathing.  Denies any changes in taste or smell.  She has been treating with Tylenol and fluids and rest.  She has no rash.  She does have tick exposures in her yard but has not seen one.  Past Medical History:  Diagnosis Date  . Acute bacterial sinusitis    dx 01-22-2015  . Arthritis   . Borderline glaucoma    bilatera eyes  . Constipation 06/17/2015  . History of colon polyps   . Hydronephrosis, right   . Hyperlipidemia   . Migraine   . Preventative health care 06/15/2015  . Right flank pain    intermittant  . Wears glasses     Past Surgical History:  Procedure Laterality Date  . ABDOMINAL HYSTERECTOMY   1997   w/ Bladder suspension  . AUGMENTATION MAMMAPLASTY Bilateral 2003  . BREAST ENHANCEMENT SURGERY Bilateral 2003  . COLONOSCOPY  03-04-2007  . CYSTOSCOPY WITH RETROGRADE PYELOGRAM, URETEROSCOPY AND STENT PLACEMENT Right 01/24/2015   Procedure: CYSTOSCOPY WITH RIGHT RETROGRADE PYELOGRAM, DIAGNOSTIC URETEROSCOPY ;  Surgeon: Cleon Gustin, MD;  Location: [facility];  Service: Urology;  Laterality: Right;  [place] [id number]  . TUBAL LIGATION      Family History  Problem Relation Age of Onset  . COPD Mother   . Prostate cancer Father   . Colon cancer Maternal Grandmother   . Colon cancer Paternal Grandfather   . Alcohol abuse Sister   . Cirrhosis Sister   . Breast cancer Neg Hx     Social History   Socioeconomic History  . Marital status: Married    Spouse name: Not on file  .  Number of children: 2  . Years of education: Not on file  . Highest education level: Some college, no degree  Occupational History  . Occupation: retired  Scientific laboratory technician  . Financial resource strain: Not on file  . Food insecurity    Worry: Not on file    Inability: Not on file  . Transportation needs    Medical: Not on file    Non-medical: Not on file  Tobacco Use  . Smoking status: Never Smoker  . Smokeless tobacco: Never Used  Substance and Sexual Activity  . Alcohol use: Yes    Alcohol/week: 2.0 standard drinks    Types: 2 Glasses of wine per week    Comment: ocassionally  . Drug use: No  . 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 avoid dairy  Lifestyle  . Physical activity    Days per week: Not on file    Minutes per session: Not on file  . Stress: Not on file  Relationships  . Social Herbalist on phone: Not on file    Gets together: Not on file    Attends religious service: Not on file    Active member of club or organization: Not on file    Attends meetings of clubs or  organizations: Not on file    Relationship status: Not on file  . Intimate partner violence    Fear of current or ex partner: Not on file    Emotionally abused: Not on file    Physically abused: Not on file    Forced sexual activity: Not on file  Other Topics Concern  . Not on file  Social History Narrative   Pt lives in 3 story home with her husband   Has 2 adult children   Some college   retired    Outpatient Medications Prior to Visit  Medication Sig Dispense Refill  . pantoprazole (PROTONIX) 40 MG tablet Take 1 tablet (40 mg total) by mouth daily. Take 1 tab twice daily for 2 weeks and then 1 tab daily for 2 weeks. 32 tablet 0  . estradiol (ESTRACE) 0.1 MG/GM vaginal cream Insert 2g daily vaginally for 1 week, then reduce to 1 g daily for 2 weeks, followed by a maintenance dose of 1 g  1-2  times per week. (Patient not taking: Reported on 12/29/2018) 42.5 g 11   No facility-administered medications prior to visit.     Allergies  Allergen Reactions  . Dilaudid [Hydromorphone Hcl] Itching    ROS Review of Systems  Constitutional: Positive for fatigue. Negative for chills, diaphoresis, fever and unexpected weight change.  HENT: Positive for congestion, rhinorrhea and sneezing. Negative for sinus pressure, sinus pain, sore throat and voice change.   Eyes: Negative for photophobia and visual disturbance.  Respiratory: Negative for chest tightness, shortness of breath and wheezing.   Cardiovascular: Negative.   Gastrointestinal: Positive for nausea. Negative for abdominal pain and vomiting.  Genitourinary: Negative.   Musculoskeletal: Positive for myalgias.  Skin: Negative for rash.  Allergic/Immunologic: Negative for immunocompromised state.  Neurological: Positive for headaches.  Hematological: Does not bruise/bleed easily.  Psychiatric/Behavioral: Negative.       Objective:    Physical Exam  Constitutional: She is oriented to person, place, and time. She appears well-developed and well-nourished. No distress.  HENT:  Head: Normocephalic and atraumatic.  Right Ear: External ear normal.  Left Ear: External ear normal.  Mouth/Throat: Oropharynx is clear and moist.  Eyes: Pupils are equal, round, and reactive to light. Conjunctivae are normal. Right eye exhibits no discharge. Left eye exhibits no discharge. No scleral icterus.  Neck: No JVD present. No tracheal deviation present.  Pulmonary/Chest: Effort normal. No stridor.  Neurological: She is alert and oriented to person, place, and time.  Skin: Skin is warm and dry. She is not diaphoretic.     Psychiatric: She has a normal mood and affect. Her behavior is normal.    LMP 10/18/1995  Wt Readings from Last 3 Encounters:  12/29/18 140 lb (63.5 kg)  11/15/18 136 lb (61.7 kg)  09/29/18 142 lb (64.4 kg)   BP  Readings from Last 3 Encounters:  12/29/18 130/90  09/29/18 110/80  02/10/18 132/82   Guideline developer:  UpToDate (see UpToDate for funding source) Date Released: June 2014  Health Maintenance Due  Topic Date Due  . HIV Screening  12/08/1969  . COLON CANCER SCREENING ANNUAL FOBT  12/03/2017    There are no preventive care reminders to display for this patient.  Lab Results  Component Value Date   TSH 2.05 06/29/2017   Lab Results  Component Value Date   WBC 5.7 12/29/2018   HGB 13.2 12/29/2018   HCT 38.6 12/29/2018   MCV 93.3 12/29/2018   PLT 325.0 12/29/2018  Lab Results  Component Value Date   NA 140 06/29/2017   K 4.1 06/29/2017   CO2 29 06/29/2017   GLUCOSE 92 06/29/2017   BUN 17 06/29/2017   CREATININE 0.78 06/29/2017   BILITOT 0.5 06/29/2017   ALKPHOS 53 06/29/2017   AST 18 06/29/2017   ALT 14 06/29/2017   PROT 6.9 06/29/2017   ALBUMIN 4.2 06/29/2017   CALCIUM 9.2 06/29/2017   GFR 79.40 06/29/2017   Lab Results  Component Value Date   CHOL 151 06/29/2017   Lab Results  Component Value Date   HDL 66.90 06/29/2017   Lab Results  Component Value Date   LDLCALC 72 06/29/2017   Lab Results  Component Value Date   TRIG 63.0 06/29/2017   Lab Results  Component Value Date   CHOLHDL 2 06/29/2017   No results found for: HGBA1C    Assessment & Plan:   Problem List Items Addressed This Visit    None      No orders of the defined types were placed in this encounter.   Follow-up: Return in about 1 week (around 01/22/2019), or if symptoms worsen or fail to improve.  Discussed self-isolation for her home.  She will proceed to the emergency room with any difficulty breathing or shortness of breath.  Discussed the possibility of tick fever.  Return to the clinic with any rash on her hands or feet.  Patient was sent for COVID testing. Virtual Visit via Video Note  I connected with Alicia Summers on 01/15/19 at  9:20 AM EDT by a video enabled  telemedicine application and verified that I am speaking with the correct person using two identifiers.  Location: Patient: home Provider:    I discussed the limitations of evaluation and management by telemedicine and the availability of in person appointments. The patient expressed understanding and agreed to proceed.  History of Present Illness:    Observations/Objective:   Assessment and Plan:   Follow Up Instructions:    I discussed the assessment and treatment plan with the patient. The patient was provided an opportunity to ask questions and all were answered. The patient agreed with the plan and demonstrated an understanding of the instructions.   The patient was advised to call back or seek an in-person evaluation if the symptoms worsen or if the condition fails to improve as anticipated.  I provided 20 minutes of non-face-to-face time during this encounter.   Libby Maw, MD

## 2019-01-15 NOTE — Telephone Encounter (Signed)
Pt called back and appt scheduled at [facility] 01/17/19 @ 2:30. Pt informed of testing address, to wear mask to testing site and to stay in car. Pt verbalized understanding.

## 2019-01-15 NOTE — Telephone Encounter (Signed)
Called pt and left VM to call back to schedule testing.

## 2019-01-15 NOTE — Addendum Note (Signed)
Addended by: Corky Sox E on: 01/15/2019 01:05 PM   Modules accepted: Orders

## 2019-01-17 ENCOUNTER — Other Ambulatory Visit: Payer: 59

## 2019-01-17 DIAGNOSIS — Z20822 Contact with and (suspected) exposure to covid-19: Secondary | ICD-10-CM

## 2019-01-19 ENCOUNTER — Ambulatory Visit: Payer: Self-pay

## 2019-01-19 ENCOUNTER — Ambulatory Visit (HOSPITAL_BASED_OUTPATIENT_CLINIC_OR_DEPARTMENT_OTHER): Payer: 59

## 2019-01-19 NOTE — Telephone Encounter (Signed)
Patient called and says she had a virtual visit on Saturday, 01/15/19 with Dr. Ethelene Hal and was tested for covid on Monday. She says her symptoms are improving, but she feels something in her chest. I asked her to elaborate on how she's feeling in her chest, is it congestion, SOB. She says she feels a little SOB, but her oxygen saturations are ranging 97-98% while sitting. I asked when she gets up to walk around her home is she gasping for breath, hard to draw breath in, she denies. She asked at what point should she be concerned. I advised it's like walking a small hill and you get OOB, but then you recover in a few minutes, explained that if her breath doesn't recover and the oxygen level drops, then that is the point she should go to the ED, she appreciated the analogy and says she will keep that in mind that she is not that bad with her breathing. She says her other symptoms are improving, she feels her fever has broken last night. She asked how long should the symptoms last. I advised it's a 14 days span with Covid symptoms and being in quarantine with a positive result, so during those days, the symptoms should start improving without medication and if at any time the symptoms seem to be getting worse or not improving, to notify PCP for evaluation, she verbalized understanding. She asks will she be notified by the office of a positive result, I advised yes and more instruction will follow at that time, she verbalized understanding.  Reason for Disposition . Health Information question, no triage required and triager able to answer question  Protocols used: INFORMATION ONLY CALL-A-AH

## 2019-01-21 ENCOUNTER — Telehealth: Payer: Self-pay | Admitting: *Deleted

## 2019-01-21 NOTE — Telephone Encounter (Signed)
Please advise 

## 2019-01-21 NOTE — Telephone Encounter (Signed)
Opened in error

## 2019-01-21 NOTE — Telephone Encounter (Signed)
Let her know the results have slowed down again this week but she should hear by beginning of next week and the results are usually released to her as soon as they are released to [place] but I will keep an eye out for them. Offer her a follow up visit with me next week to assess how she is doing and discuss disease course and strategies.

## 2019-01-22 LAB — NOVEL CORONAVIRUS, NAA: SARS-CoV-2, NAA: NOT DETECTED

## 2019-01-24 NOTE — Telephone Encounter (Signed)
Called patient left message for patient to call the office back  

## 2019-02-11 ENCOUNTER — Other Ambulatory Visit: Payer: Self-pay

## 2019-02-11 DIAGNOSIS — K219 Gastro-esophageal reflux disease without esophagitis: Secondary | ICD-10-CM

## 2019-02-11 MED ORDER — PANTOPRAZOLE SODIUM 40 MG PO TBEC: 40.0000 mg | DELAYED_RELEASE_TABLET | Freq: Every day | ORAL | 2 refills | Status: AC

## 2019-08-17 IMAGING — US US AORTA
1 series · 14 of 20 positions shown · non-contrast
Comparison: CT on 12/21/2014

CLINICAL DATA: Pulsatile abdominal mass on exam.  Hyperlipidemia.

EXAM:
ULTRASOUND OF ABDOMINAL AORTA
TECHNIQUE: Ultrasound examination of the abdominal aorta was performed to
evaluate for abdominal aortic aneurysm.

[Series 1: us aorta · 0.22mm/px · 14 of 20 slices shown]
[im 1/20]
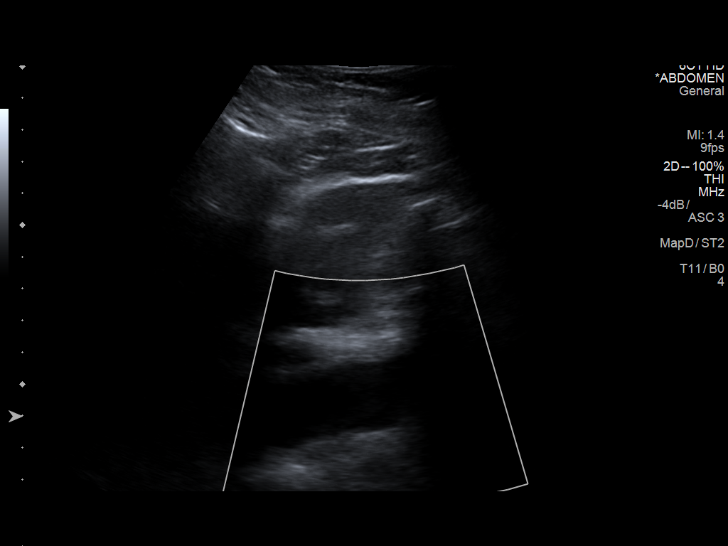
[im 3/20]
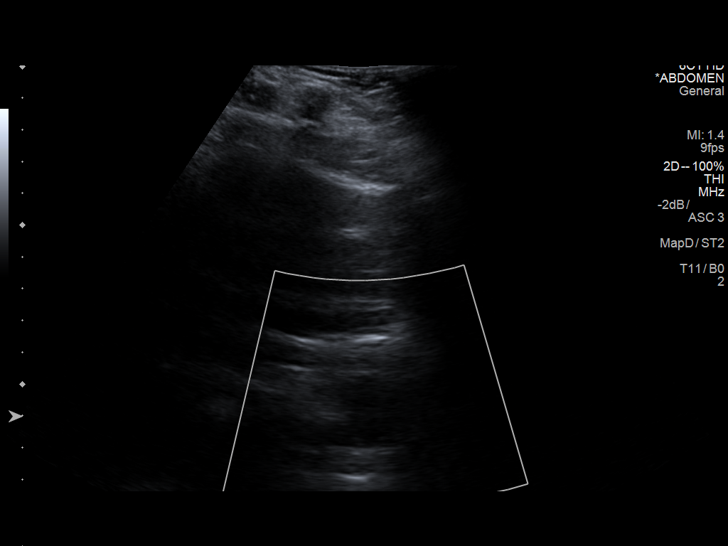
[im 4/20]
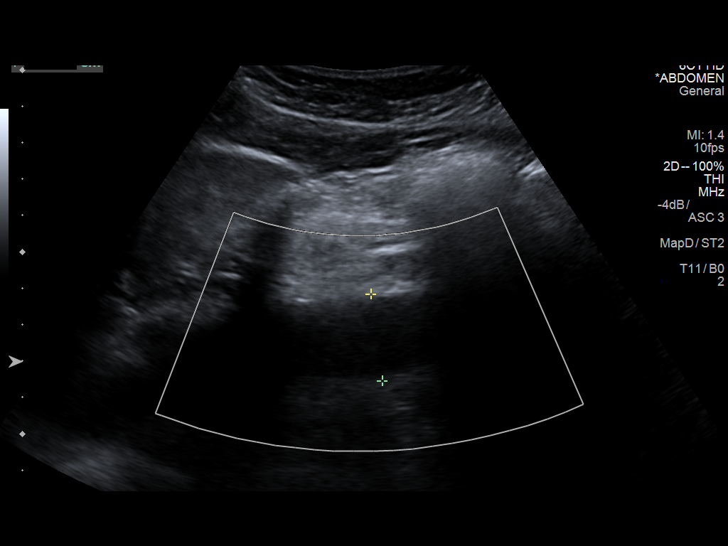
[im 6/20]
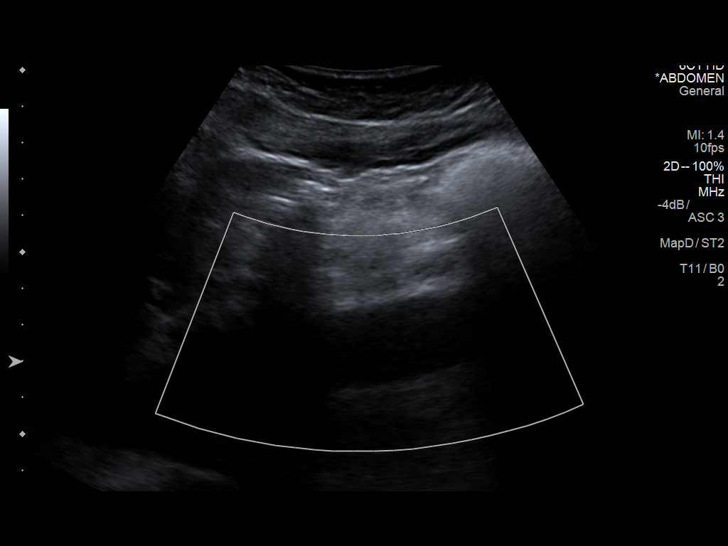
[im 7/20]
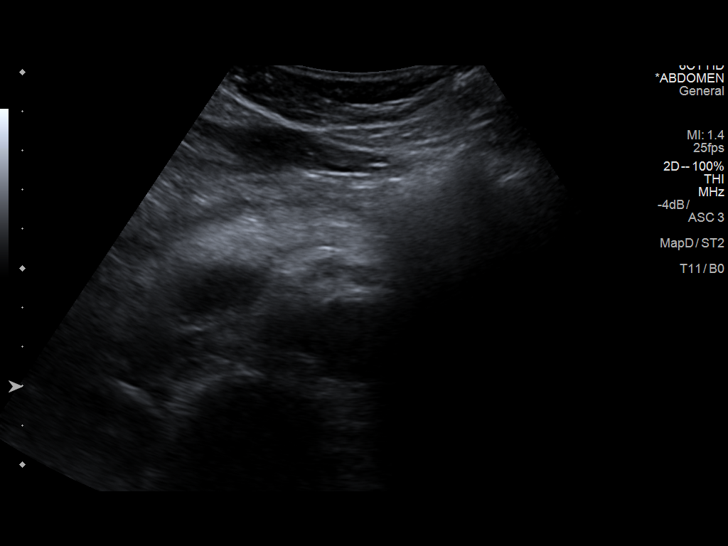
[im 8/20]
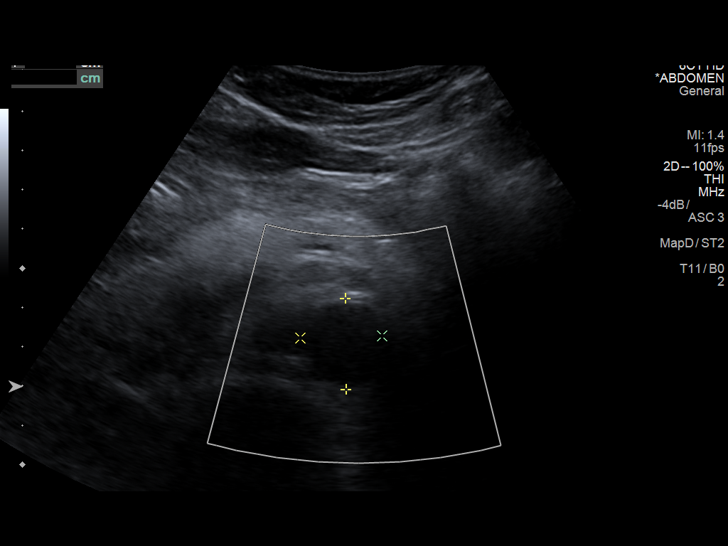
[im 10/20]
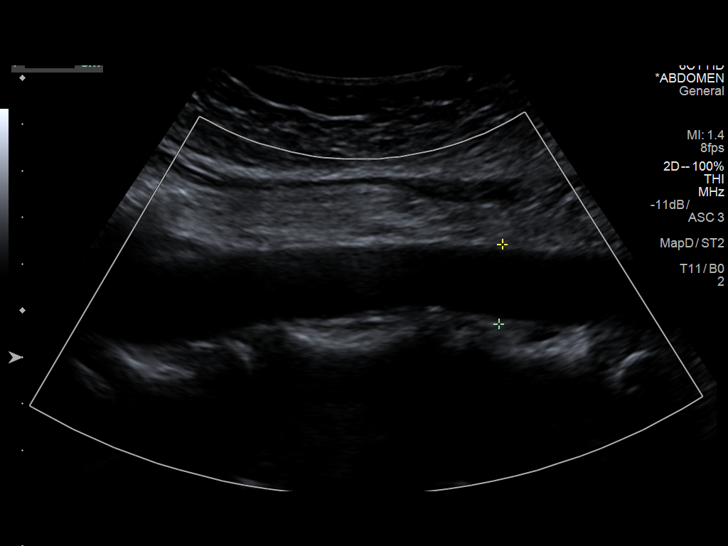
[im 11/20]
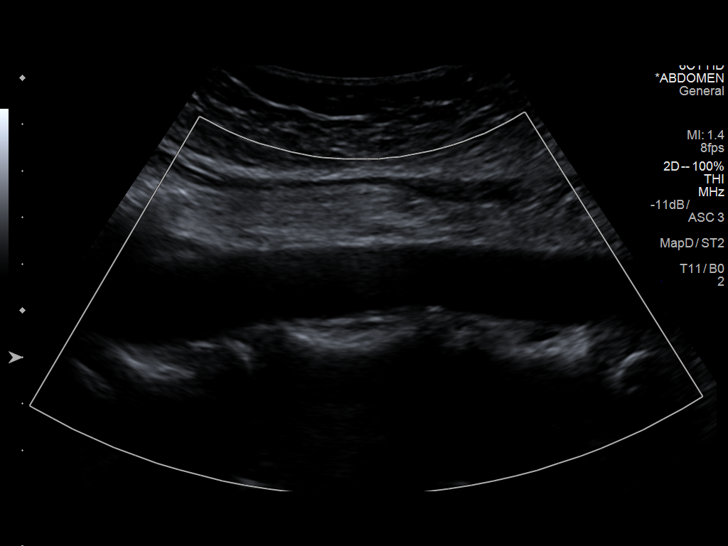
[im 13/20]
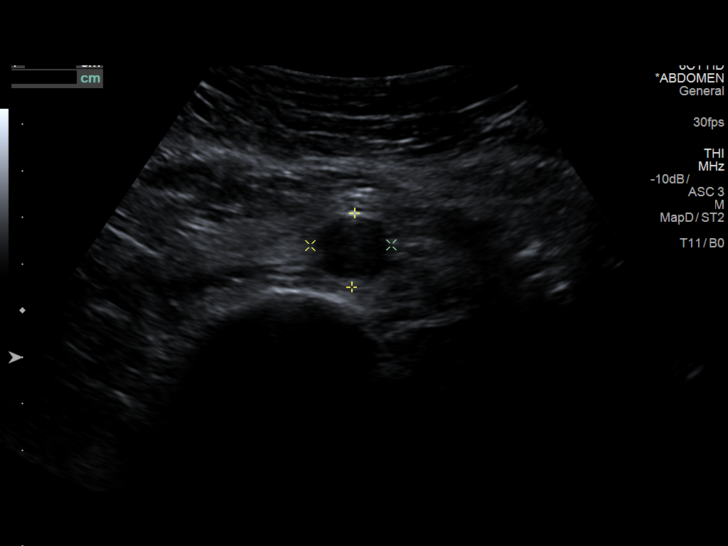
[im 14/20]
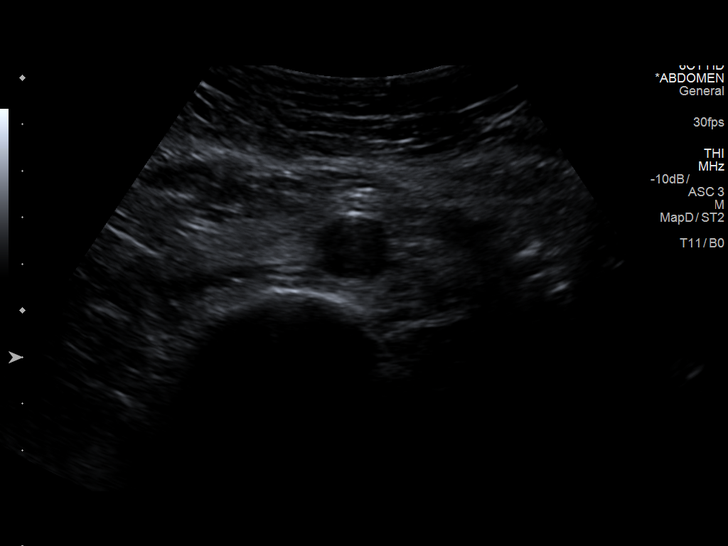
[im 16/20]
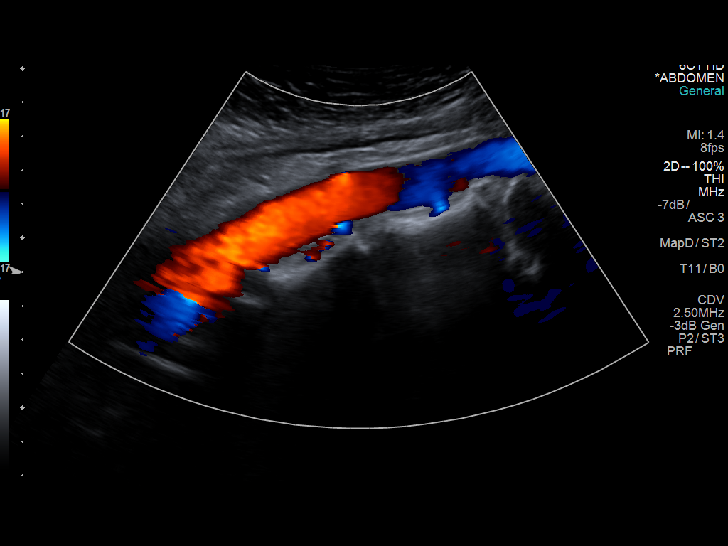
[im 17/20]
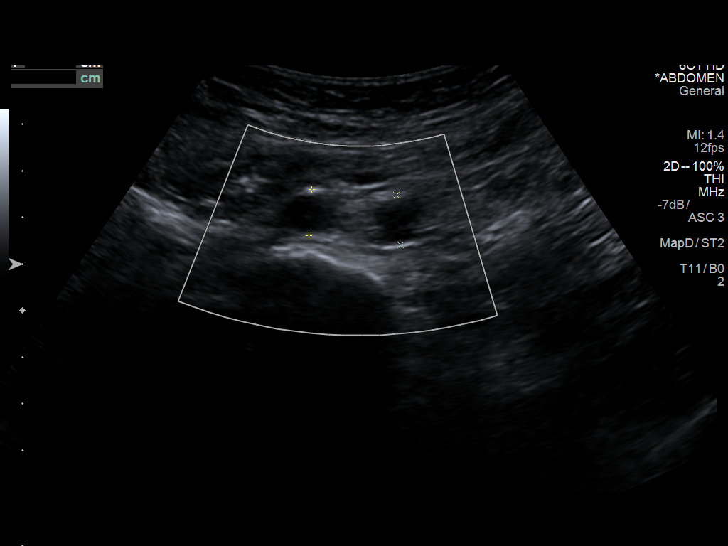
[im 18/20]
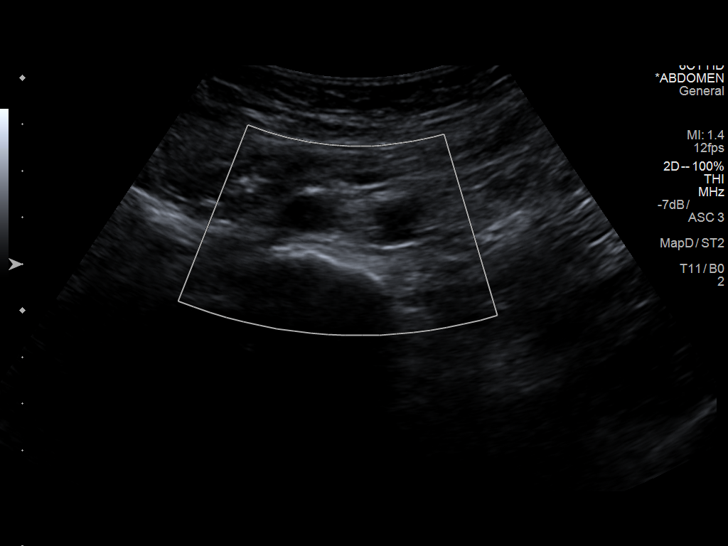
[im 20/20]
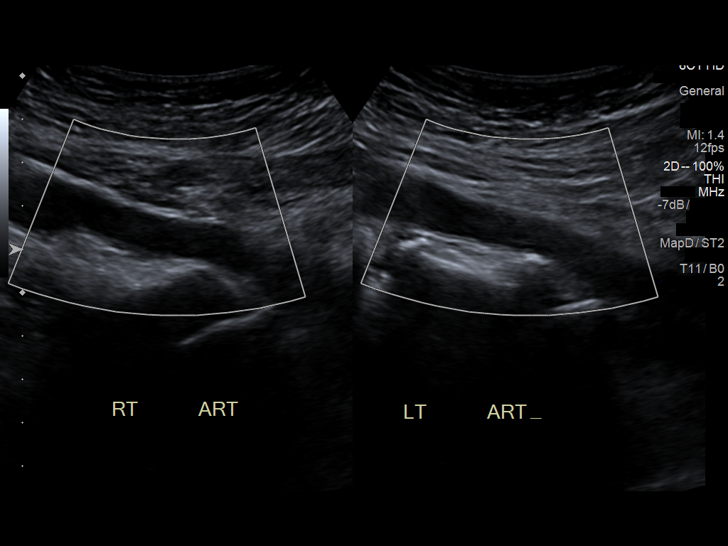

[14 of 20 positions shown; findings below may reference images not displayed]

FINDINGS: Abdominal aortic measurements as follows:

Proximal:  2.5 cm

Mid:  2.4 cm

Distal:  1.7 cm
IMPRESSION: No evidence of abdominal aortic aneurysm.

## 2020-03-30 DIAGNOSIS — K219 Gastro-esophageal reflux disease without esophagitis: Secondary | ICD-10-CM | POA: Insufficient documentation

## 2020-08-21 IMAGING — US ULTRASOUND ABDOMEN COMPLETE
1 series · 14 of 25 positions shown · non-contrast
Comparison: CT abdomen and pelvis December 21, 2014

CLINICAL DATA: Abdominal pain and diarrhea

EXAM:
ABDOMEN ULTRASOUND COMPLETE

[Series 1: ultrasound abdomen complete · 14 of 110 slices shown]
[im 1/110]
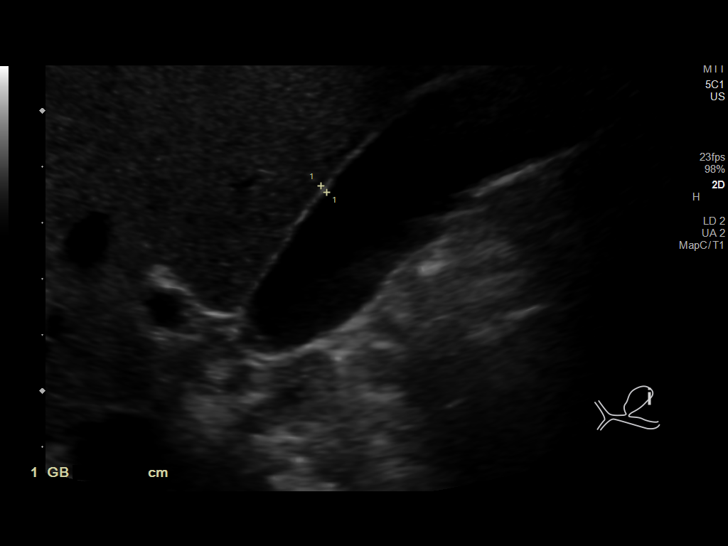
[im 10/110]
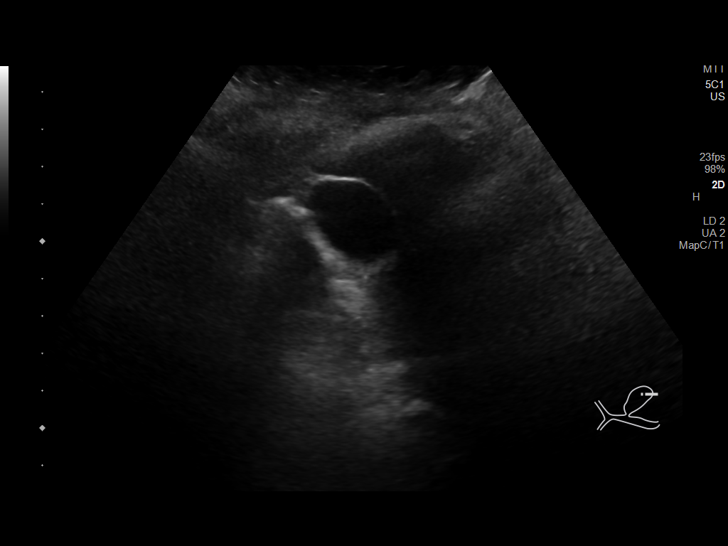
[im 19/110]
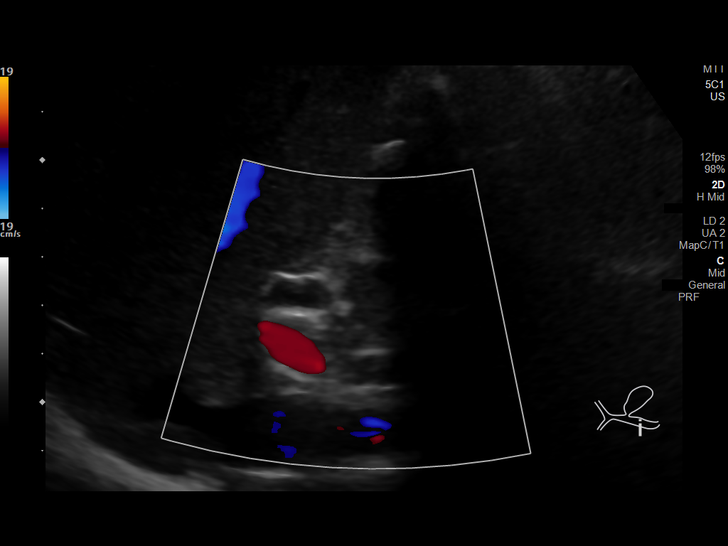
[im 28/110]
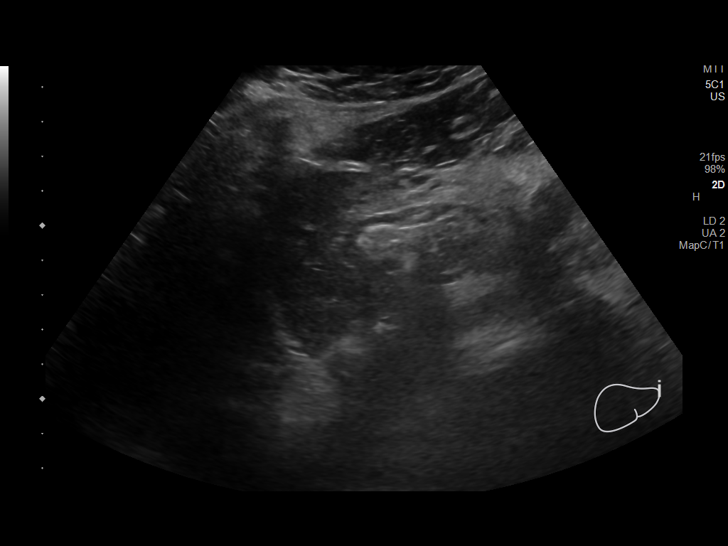
[im 37/110]
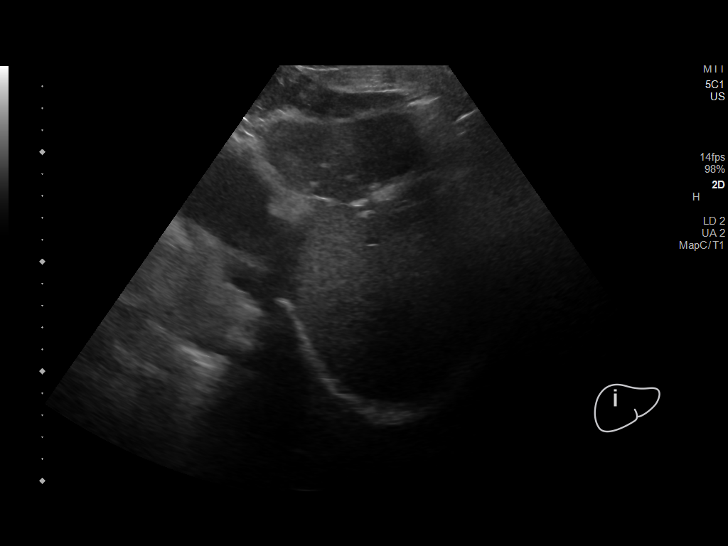
[im 41/110]
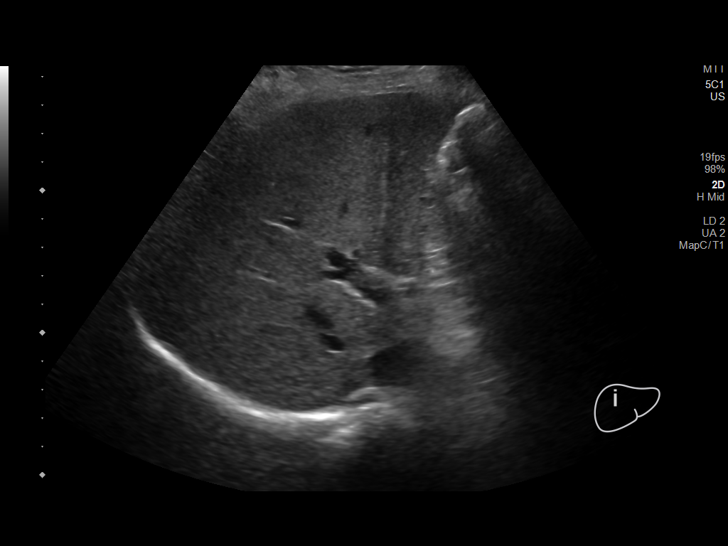
[im 50/110]
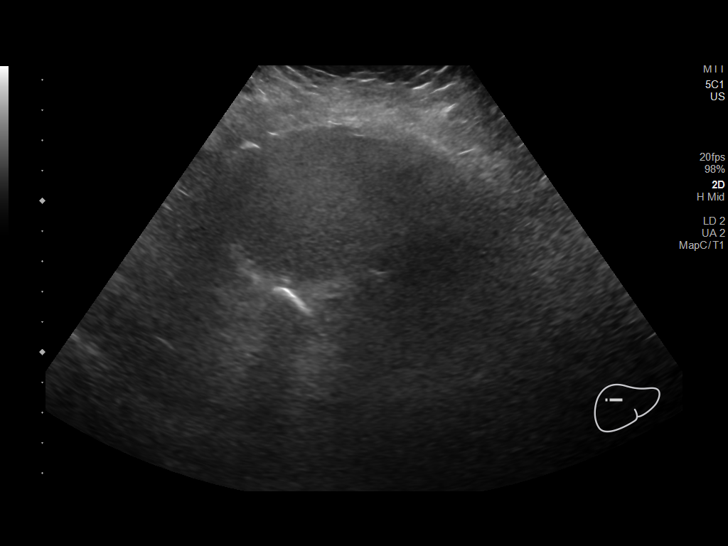
[im 60/110]
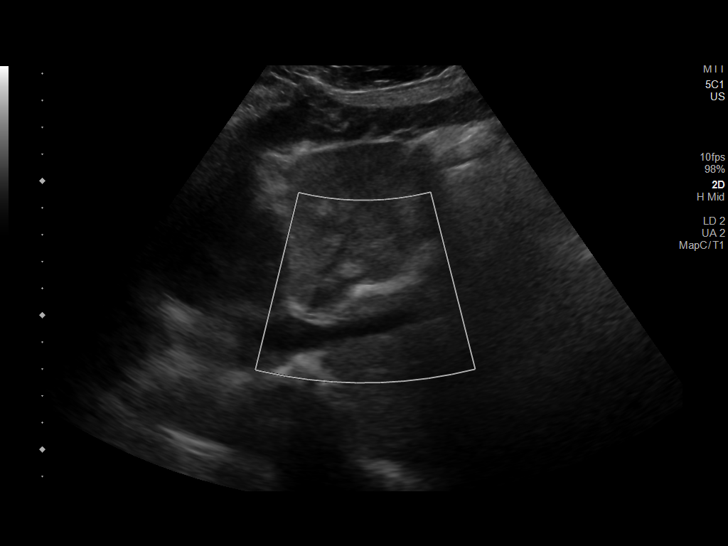
[im 69/110]
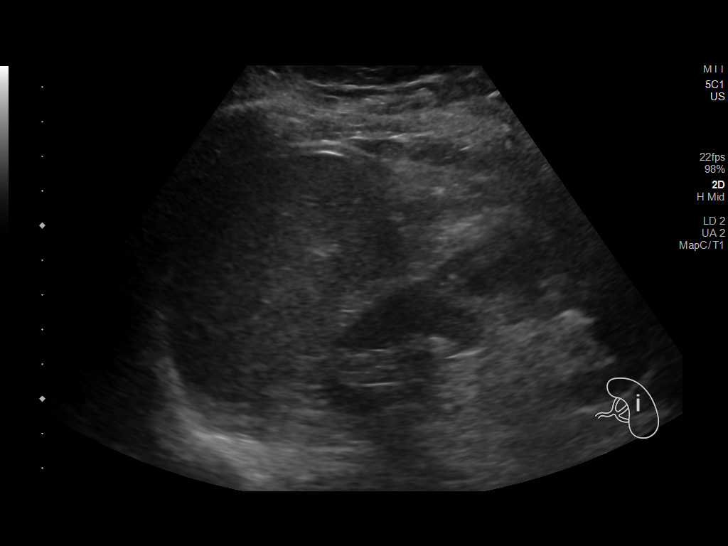
[im 73/110]
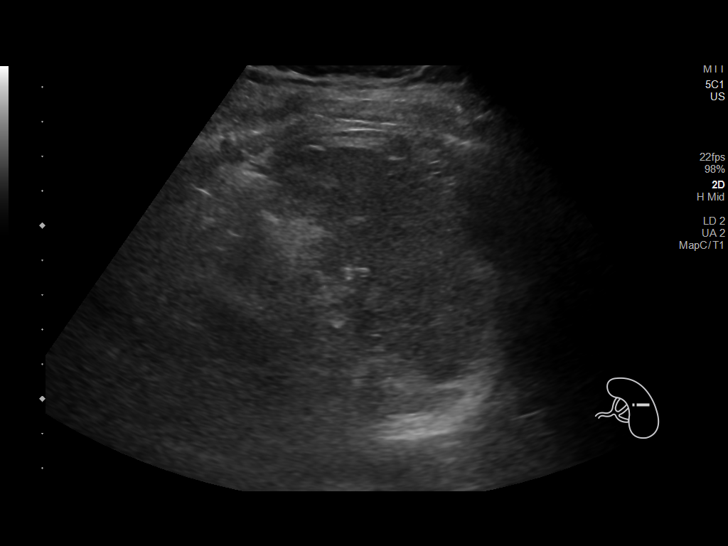
[im 82/110]
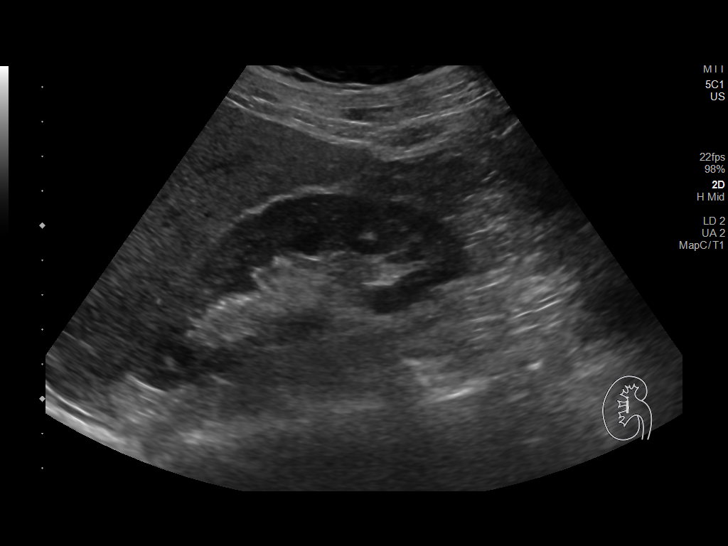
[im 91/110]
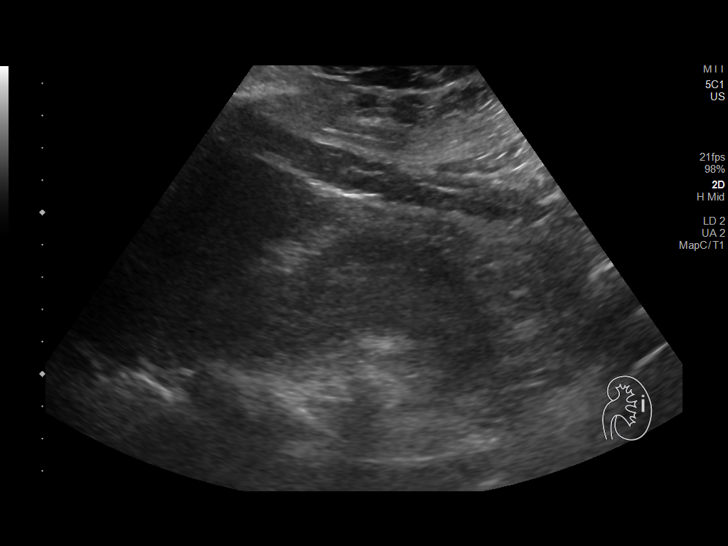
[im 100/110]
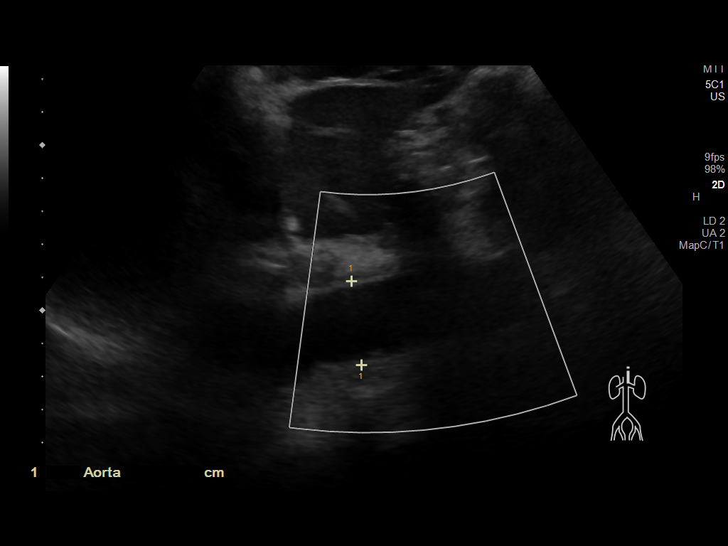
[im 110/110]
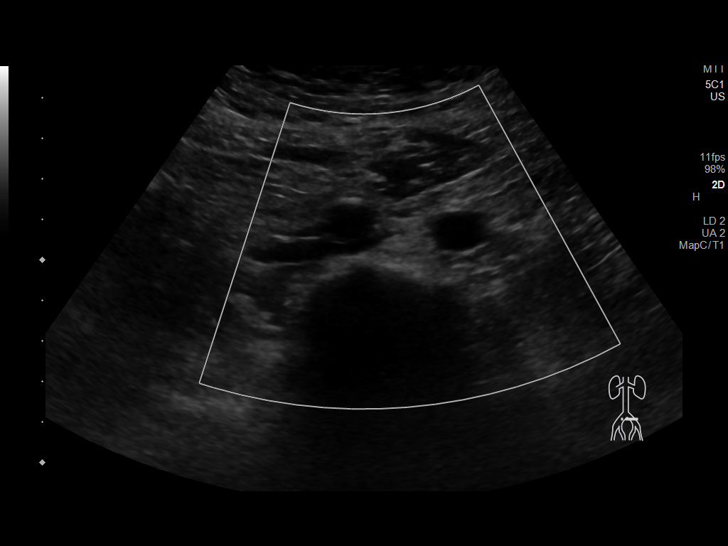

[14 of 25 positions shown; findings below may reference images not displayed]

FINDINGS: Gallbladder: No gallstones or wall thickening visualized. There is
no pericholecystic fluid. No sonographic Murphy sign noted by
sonographer.

Common bile duct: Diameter: 6 mm. No intrahepatic, common hepatic,
or common bile duct dilatation.

Liver: No focal lesion identified. Within normal limits in
parenchymal echogenicity. Portal vein is patent on color Doppler
imaging with normal direction of blood flow towards the liver.

IVC: No abnormality visualized.

Pancreas: No pancreatic mass or inflammatory focus.

Spleen: Size and appearance within normal limits.

Right Kidney: Length: 10.2 cm. Echogenicity within normal limits. No
mass or hydronephrosis visualized.

Left Kidney: Length: 9.9 cm. Echogenicity within normal limits. No
mass or hydronephrosis visualized.

Abdominal aorta: No aneurysm visualized.

Other findings: No demonstrable ascites.
IMPRESSION: Study within normal limits.

## 2020-08-28 DIAGNOSIS — L84 Corns and callosities: Secondary | ICD-10-CM | POA: Insufficient documentation

## 2020-10-30 ENCOUNTER — Encounter: Payer: Self-pay | Admitting: Podiatry

## 2020-10-30 ENCOUNTER — Ambulatory Visit (INDEPENDENT_AMBULATORY_CARE_PROVIDER_SITE_OTHER): Payer: Medicare Other

## 2020-10-30 ENCOUNTER — Other Ambulatory Visit: Payer: Self-pay

## 2020-10-30 ENCOUNTER — Ambulatory Visit (INDEPENDENT_AMBULATORY_CARE_PROVIDER_SITE_OTHER): Payer: Medicare Other | Admitting: Podiatry

## 2020-10-30 DIAGNOSIS — M21622 Bunionette of left foot: Secondary | ICD-10-CM

## 2020-10-30 DIAGNOSIS — D2372 Other benign neoplasm of skin of left lower limb, including hip: Secondary | ICD-10-CM | POA: Diagnosis not present

## 2020-10-30 DIAGNOSIS — M2042 Other hammer toe(s) (acquired), left foot: Secondary | ICD-10-CM

## 2020-10-30 DIAGNOSIS — L84 Corns and callosities: Secondary | ICD-10-CM

## 2020-10-30 DIAGNOSIS — M7752 Other enthesopathy of left foot: Secondary | ICD-10-CM | POA: Diagnosis not present

## 2020-10-31 NOTE — Progress Notes (Signed)
Subjective:  Patient ID: Alicia Summers, female    DOB: 04-21-1956,  MRN: [id number] HPI Chief Complaint  Patient presents with  . Foot Pain    4th/5th between toes and 5th MPJ left - callused areas, aching 5th MPJ x couple years, seen 3 other docs-no success with any  . New Patient (Initial Visit)    65 y.o. female presents with the above complaint.   ROS: Denies fever chills nausea vomiting muscle aches pains calf pain back pain chest pain shortness of breath.  Past Medical History:  Diagnosis Date  . Acute bacterial sinusitis    dx 01-22-2015  . Arthritis   . Borderline glaucoma    bilatera eyes  . Constipation 06/17/2015  . History of colon polyps   . Hydronephrosis, right   . Hyperlipidemia   . Migraine   . Preventative health care 06/15/2015  . Right flank pain    intermittant  . Wears glasses    Past Surgical History:  Procedure Laterality Date  . ABDOMINAL HYSTERECTOMY   1997   w/ Bladder suspension  . AUGMENTATION MAMMAPLASTY Bilateral 2003  . BREAST ENHANCEMENT SURGERY Bilateral 2003  . COLONOSCOPY  03-04-2007  . CYSTOSCOPY WITH RETROGRADE PYELOGRAM, URETEROSCOPY AND STENT PLACEMENT Right 01/24/2015   Procedure: CYSTOSCOPY WITH RIGHT RETROGRADE PYELOGRAM, DIAGNOSTIC URETEROSCOPY ;  Surgeon: Cleon Gustin, MD;  Location: [facility];  Service: Urology;  Laterality: Right;  [place] [id number]  . TUBAL LIGATION      Current Outpatient Medications:  .  cetirizine (ZYRTEC) 10 MG tablet, Take 10 mg by mouth daily., Disp: , Rfl:  .  pantoprazole (PROTONIX) 40 MG tablet, Take 1 tablet (40 mg total) by mouth daily before breakfast. Take 1 tab twice daily for 2 weeks and then 1 tab daily for 2 weeks., Disp: 32 tablet, Rfl: 2 .  rosuvastatin (CRESTOR) 5 MG tablet, Take 5 mg by mouth daily., Disp: , Rfl:  .  timolol (TIMOPTIC) 0.5 % ophthalmic solution, 1 drop 2 (two) times daily., Disp: , Rfl:   Allergies  Allergen Reactions  .  Dilaudid [Hydromorphone Hcl] Itching  . Sunscreens Rash   Review of Systems Objective:  There were no vitals filed for this visit.  General: Well developed, nourished, in no acute distress, alert and oriented x3   Dermatological: Skin is warm, dry and supple bilateral. Nails x 10 are well maintained; remaining integument appears unremarkable at this time. There are no open sores, no preulcerative lesions, no rash or signs of infection present.  Hyperkeratotic lesion between the fourth and fifth digits of the left foot is painful.  There is no lacerations no erythema cellulitis drainage or odor.  Vascular: Dorsalis Pedis artery and Posterior Tibial artery pedal pulses are 2/4 bilateral with immedate capillary fill time. Pedal hair growth present. No varicosities and no lower extremity edema present bilateral.   Neruologic: Grossly intact via light touch bilateral. Vibratory intact via tuning fork bilateral. Protective threshold with Semmes Wienstein monofilament intact to all pedal sites bilateral. Patellar and Achilles deep tendon reflexes 2+ bilateral. No Babinski or clonus noted bilateral.   Musculoskeletal: No gross boney pedal deformities bilateral. No pain, crepitus, or limitation noted with foot and ankle range of motion bilateral. Muscular strength 5/5 in all groups tested bilateral.  Tailor's bunion deformity with juxtaposition of the fourth and fifth toes resulting in a reactive hyperkeratotic lesion.  Gait: Unassisted, Nonantalgic.    Radiographs:  Radiographs taken today of the left foot demonstrate an  osseously mature individual mild tailor's bunion deformity with medial deviation of the fifth toe.  No other significant osseous abnormalities are noted.  Assessment & Plan:   Assessment: Painful callus between the fourth and fifth toes heloma molle with tailor's bunion deformity.  Plan: Discussed etiology pathology conservative versus surgical therapies at this point she will  return later in the summer for surgical consideration.  We did debride the reactive hyper keratoma today.  Debridement was benign skin lesion.     [name] T. [place], [place]
